# Patient Record
Sex: Female | Born: 1985 | Race: White | Hispanic: No | Marital: Married | Smoking: Former smoker
Health system: Southern US, Community
[De-identification: ages and names within clinical notes are randomized; demographics above are authoritative.]

## PROBLEM LIST (undated history)

## (undated) ENCOUNTER — Inpatient Hospital Stay (HOSPITAL_COMMUNITY): Payer: Self-pay

## (undated) DIAGNOSIS — F32A Depression, unspecified: Secondary | ICD-10-CM

## (undated) DIAGNOSIS — Z8619 Personal history of other infectious and parasitic diseases: Secondary | ICD-10-CM

## (undated) DIAGNOSIS — F1911 Other psychoactive substance abuse, in remission: Secondary | ICD-10-CM

## (undated) DIAGNOSIS — F419 Anxiety disorder, unspecified: Secondary | ICD-10-CM

## (undated) DIAGNOSIS — F329 Major depressive disorder, single episode, unspecified: Secondary | ICD-10-CM

## (undated) HISTORY — DX: Depression, unspecified: F32.A

## (undated) HISTORY — DX: Other psychoactive substance abuse, in remission: F19.11

## (undated) HISTORY — DX: Major depressive disorder, single episode, unspecified: F32.9

## (undated) HISTORY — DX: Personal history of other infectious and parasitic diseases: Z86.19

## (undated) HISTORY — DX: Anxiety disorder, unspecified: F41.9

## (undated) HISTORY — PX: NO PAST SURGERIES: SHX2092

---

## 2015-07-13 ENCOUNTER — Emergency Department (HOSPITAL_COMMUNITY)
Admission: EM | Admit: 2015-07-13 | Discharge: 2015-07-13 | Disposition: A | Discharge status: Home / Self Care | Payer: Self-pay | Attending: Emergency Medicine | Admitting: Emergency Medicine

## 2015-07-13 ENCOUNTER — Encounter (HOSPITAL_COMMUNITY): Payer: Self-pay | Admitting: Emergency Medicine

## 2015-07-13 DIAGNOSIS — H6001 Abscess of right external ear: Secondary | ICD-10-CM | POA: Insufficient documentation

## 2015-07-13 DIAGNOSIS — F17210 Nicotine dependence, cigarettes, uncomplicated: Secondary | ICD-10-CM | POA: Insufficient documentation

## 2015-07-13 MED ORDER — LIDOCAINE HCL 2 % IJ SOLN
10.0000 mL | Freq: Once | INTRAMUSCULAR | Status: AC
  Administered 2015-07-13: 200 mg via INTRADERMAL
  Filled 2015-07-13: qty 20

## 2015-07-13 NOTE — ED Notes (Signed)
I&D tray set up at bedside.

## 2015-07-13 NOTE — ED Provider Notes (Signed)
CSN: [id number]     Arrival date & time 07/13/15  0906 History  By signing my name below, I, Donna Nunez, attest that this documentation has been prepared under the direction and in the presence of non-physician practitioner, Fayrene Helper, PA-C. Electronically Signed: Freida Nunez, Scribe. 07/13/2015. 9:27 AM.      Chief Complaint  Patient presents with  . Abscess   The history is provided by the patient. No language interpreter was used.     HPI Comments:  Donna Nunez is a 30 y.o. female who presents to the Emergency Department complaining of a small painful bump behind the right ear. She first noticed the area ~ 2 weeks ago and notes it has gradually worsened since. She has been applying a warm heating pad to the area with minimal relief. Her tetanus is UTD within the last 5 years. Pt recently increased the sized of her ear gauges.   History reviewed. No pertinent past medical history. History reviewed. No pertinent past surgical history. No family history on file. Social History  Substance Use Topics  . Smoking status: Current Every Day Smoker -- 1.00 packs/day    Types: Cigarettes  . Smokeless tobacco: None  . Alcohol Use: Yes     Comment: socially   OB History    No data available     Review of Systems  Constitutional: Negative for fever.  Skin:       +Small "bump" behind right ear    Allergies  Review of patient's allergies indicates no known allergies.  Home Medications   Prior to Admission medications   Not on File   BP 126/70 mmHg  Pulse 62  Temp(Src) 97.9 F (36.6 C) (Oral)  Resp 20  Ht  (1.727 m)  Wt 185 lb (83.915 kg)  BMI 28.14 kg/m2  SpO2 100%  LMP 06/12/2015 Physical Exam  Constitutional: She is oriented to person, place, and time. She appears well-developed and well-nourished. No distress.  HENT:  Head: Normocephalic and atraumatic.  Eyes: Conjunctivae are normal.  Cardiovascular: Normal rate.   Pulmonary/Chest: Effort normal.   Abdominal: She exhibits no distension.  Neurological: She is alert and oriented to person, place, and time.  Skin: Skin is warm and dry. No erythema.  Pea sized area of induration and fluctuance noted to posterior right ear with TTP; no skin erythema    Psychiatric: She has a normal mood and affect.  Nursing note and vitals reviewed.   ED Course  Procedures   DIAGNOSTIC STUDIES:  Oxygen Saturation is 100% on RA, normal by my interpretation.    COORDINATION OF CARE:  9:30 AM Discussed treatment plan with pt at bedside and pt agreed to plan.  INCISION AND DRAINAGE PROCEDURE NOTE: Patient identification was confirmed and verbal consent was obtained. This procedure was performed by Fayrene Helper, PA-C at 9:57 AM. Site: R posterior auricular region Sterile procedures observed Needle size: 25 gauge Anesthetic used (type and amt): 1ml Blade size: 11 Drainage: mild Complexity: Simple Packing used: none Site anesthetized, incision made over site, wound drained and explored loculations, rinsed with copious amounts of normal saline, wound packed with sterile gauze, covered with dry, sterile dressing.  Pt tolerated procedure well without complications.  Instructions for care discussed verbally and pt provided with additional written instructions for homecare and f/u.   MDM   Final diagnoses:  Abscess of right external ear    BP 126/70 mmHg  Pulse 62  Temp(Src) 97.9 F (36.6 C) (Oral)  Resp 20  Ht  (1.727 m)  Wt 83.915 kg  BMI 28.14 kg/m2  SpO2 100%  LMP 06/12/2015   Patient with skin abscess. Incision and drainage performed in the ED today.  Abscess was not large enough to warrant packing or drain placement. Wound recheck in 2 days. Supportive care and return precautions discussed.  . The patient appears reasonably screened and/or stabilized for discharge and I doubt any other emergent medical condition requiring further screening, evaluation, or treatment in the ED  prior to discharge.  I personally performed the services described in this documentation, which was scribed in my presence. The recorded information has been reviewed and is accurate.      Fayrene Helper, PA-C 07/13/15 1014  Vanetta Mulders, MD 07/15/15 2235

## 2015-07-13 NOTE — ED Notes (Signed)
Patient states abscess behind R ear.  Patient states has been getting worse x 2 wks.

## 2015-07-13 NOTE — Discharge Instructions (Signed)
Continue to apply warm compress several times daily to decrease risk of recurrent abscess.  Apply neosporin and dressing to keep wound clean  Abscess An abscess is an infected area that contains a collection of pus and debris.It can occur in almost any part of the body. An abscess is also known as a furuncle or boil. CAUSES  An abscess occurs when tissue gets infected. This can occur from blockage of oil or sweat glands, infection of hair follicles, or a minor injury to the skin. As the body tries to fight the infection, pus collects in the area and creates pressure under the skin. This pressure causes pain. People with weakened immune systems have difficulty fighting infections and get certain abscesses more often.  SYMPTOMS Usually an abscess develops on the skin and becomes a painful mass that is red, warm, and tender. If the abscess forms under the skin, you may feel a moveable soft area under the skin. Some abscesses break open (rupture) on their own, but most will continue to get worse without care. The infection can spread deeper into the body and eventually into the bloodstream, causing you to feel ill.  DIAGNOSIS  Your caregiver will take your medical history and perform a physical exam. A sample of fluid may also be taken from the abscess to determine what is causing your infection. TREATMENT  Your caregiver may prescribe antibiotic medicines to fight the infection. However, taking antibiotics alone usually does not cure an abscess. Your caregiver may need to make a small cut (incision) in the abscess to drain the pus. In some cases, gauze is packed into the abscess to reduce pain and to continue draining the area. HOME CARE INSTRUCTIONS   Only take over-the-counter or prescription medicines for pain, discomfort, or fever as directed by your caregiver.  If you were prescribed antibiotics, take them as directed. Finish them even if you start to feel better.  If gauze is used, follow your  caregiver's directions for changing the gauze.  To avoid spreading the infection:  Keep your draining abscess covered with a bandage.  Wash your hands well.  Do not share personal care items, towels, or whirlpools with others.  Avoid skin contact with others.  Keep your skin and clothes clean around the abscess.  Keep all follow-up appointments as directed by your caregiver. SEEK MEDICAL CARE IF:   You have increased pain, swelling, redness, fluid drainage, or bleeding.  You have muscle aches, chills, or a general ill feeling.  You have a fever. MAKE SURE YOU:   Understand these instructions.  Will watch your condition.  Will get help right away if you are not doing well or get worse.   This information is not intended to replace advice given to you by your health care provider. Make sure you discuss any questions you have with your health care provider.   Document Released: 03/07/2005 Document Revised: 11/27/2011 Document Reviewed: 08/10/2011 Elsevier Interactive Patient Education [employer]

## 2017-06-06 ENCOUNTER — Ambulatory Visit: Payer: 59 | Admitting: Family Medicine

## 2017-06-06 ENCOUNTER — Encounter: Payer: Self-pay | Admitting: Family Medicine

## 2017-06-06 VITALS — BP 122/72 | HR 67 | Ht 68.5 in | Wt 185.8 lb

## 2017-06-06 DIAGNOSIS — Z8619 Personal history of other infectious and parasitic diseases: Secondary | ICD-10-CM

## 2017-06-06 DIAGNOSIS — Z0000 Encounter for general adult medical examination without abnormal findings: Secondary | ICD-10-CM

## 2017-06-06 DIAGNOSIS — F419 Anxiety disorder, unspecified: Secondary | ICD-10-CM

## 2017-06-06 DIAGNOSIS — F329 Major depressive disorder, single episode, unspecified: Secondary | ICD-10-CM | POA: Diagnosis not present

## 2017-06-06 DIAGNOSIS — F32A Depression, unspecified: Secondary | ICD-10-CM | POA: Insufficient documentation

## 2017-06-06 DIAGNOSIS — Z23 Encounter for immunization: Secondary | ICD-10-CM

## 2017-06-06 DIAGNOSIS — F1911 Other psychoactive substance abuse, in remission: Secondary | ICD-10-CM | POA: Insufficient documentation

## 2017-06-06 LAB — CBC WITH DIFFERENTIAL/PLATELET
BASOS ABS: 62 {cells}/uL (ref 0–200)
Basophils Relative: 0.9 %
EOS ABS: 110 {cells}/uL (ref 15–500)
Eosinophils Relative: 1.6 %
HEMATOCRIT: 41.2 % (ref 35.0–45.0)
HEMOGLOBIN: 14.1 g/dL (ref 11.7–15.5)
LYMPHS ABS: 2222 {cells}/uL (ref 850–3900)
MCH: 30.1 pg (ref 27.0–33.0)
MCHC: 34.2 g/dL (ref 32.0–36.0)
MCV: 87.8 fL (ref 80.0–100.0)
MPV: 10.7 fL (ref 7.5–12.5)
Monocytes Relative: 7.4 %
NEUTROS ABS: 3995 {cells}/uL (ref 1500–7800)
NEUTROS PCT: 57.9 %
Platelets: 291 10*3/uL (ref 140–400)
RBC: 4.69 10*6/uL (ref 3.80–5.10)
RDW: 11.6 % (ref 11.0–15.0)
Total Lymphocyte: 32.2 %
WBC: 6.9 10*3/uL (ref 3.8–10.8)
WBCMIX: 511 {cells}/uL (ref 200–950)

## 2017-06-06 LAB — POCT URINALYSIS DIP (PROADVANTAGE DEVICE)
BILIRUBIN UA: NEGATIVE
Glucose, UA: NEGATIVE mg/dL
Ketones, POC UA: NEGATIVE mg/dL
Leukocytes, UA: NEGATIVE
NITRITE UA: NEGATIVE
PH UA: 6.5 (ref 5.0–8.0)
PROTEIN UA: NEGATIVE mg/dL
RBC UA: NEGATIVE
Specific Gravity, Urine: 1.02
Urobilinogen, Ur: NEGATIVE

## 2017-06-06 LAB — COMPREHENSIVE METABOLIC PANEL
AG RATIO: 2.4 (calc) (ref 1.0–2.5)
ALKALINE PHOSPHATASE (APISO): 55 U/L (ref 33–115)
ALT: 10 U/L (ref 6–29)
AST: 17 U/L (ref 10–30)
Albumin: 4.8 g/dL (ref 3.6–5.1)
BILIRUBIN TOTAL: 0.6 mg/dL (ref 0.2–1.2)
BUN: 11 mg/dL (ref 7–25)
CALCIUM: 9.9 mg/dL (ref 8.6–10.2)
CO2: 29 mmol/L (ref 20–32)
Chloride: 103 mmol/L (ref 98–110)
Creat: 0.88 mg/dL (ref 0.50–1.10)
Globulin: 2 g/dL (calc) (ref 1.9–3.7)
Glucose, Bld: 84 mg/dL (ref 65–99)
Potassium: 4.8 mmol/L (ref 3.5–5.3)
SODIUM: 139 mmol/L (ref 135–146)
TOTAL PROTEIN: 6.8 g/dL (ref 6.1–8.1)

## 2017-06-06 MED ORDER — BUPROPION HCL ER (XL) 300 MG PO TB24: 300.0000 mg | ORAL_TABLET | Freq: Every day | ORAL | 2 refills | Status: DC

## 2017-06-06 NOTE — Patient Instructions (Signed)

## 2017-06-06 NOTE — Progress Notes (Signed)
Subjective:    Patient ID: Donna Nunez, female    DOB: 1986/03/01, 31 y.o.   MRN: [id number]  HPI Chief Complaint  Patient presents with  . New Patient (Initial Visit)    [facility] in July,    She is new to the practice and here for a complete physical exam. Previous medical care: [facility] and PCP Donna Nunez  Last CPE: years ago.   Other providers: has a therapist and goes every other week for anxiety and depression. Just had her first visit. Denies SI or HI.   Past medical history: started on Wellbutrin for depression and anxiety in October 2018. Feels more stable.  States medication worked for smoking cessation. Stopped smoking in September 2017. She was taking Wellbutrin at that time as well.  Has taken fluoxetine in past but stopped because she did not feel like it was helping.  States she was prescribed hydroxyzine for anxiety but does not like this due to sedation.   History of drug and alcohol abuse per patient. History of IV drug use.   States she was tested for all STDs in the spring and declines this today.   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   Immunizations: needs flu shot. Tdap up to date.   Health maintenance:  Mammogram: N/A Colonoscopy: N/A Last Gynecological Exam with pap smear: July 2018. States she has history HPV with high risk.  Last Menstrual cycle: 05/19/2017 Contraception: none. Ok if  Pregnancies: 1. "failed pregnancy".  Last Dental Exam: years ago Last Eye Exam: 2015   Depression screen PHQ 2/9 06/06/2017  Decreased Interest 0  Down, Depressed, Hopeless 0  PHQ - 2 Score 0    Wears seatbelt always, uses sunscreen, smoke detectors in home and functioning, does not text while driving and feels safe in home environment.   Reviewed allergies, medications, past medical, surgical, family, and social  history.    Review of Systems Review of Systems Constitutional: -fever, -chills, -sweats, -unexpected weight change,-fatigue ENT: -runny nose, -ear pain, -sore throat Cardiology:  -chest pain, -palpitations, -edema Respiratory: -cough, -shortness of breath, -wheezing Gastroenterology: -abdominal pain, -nausea, -vomiting, -diarrhea, -constipation  Hematology: -bleeding or bruising problems Musculoskeletal: -arthralgias, -myalgias, -joint swelling, -back pain Ophthalmology: -vision changes Urology: -dysuria, -difficulty urinating, -hematuria, -urinary frequency, -urgency Neurology: -headache, -weakness, -tingling, -numbness        Objective:   Physical Exam BP 122/72 (BP Location: Right Arm, Patient Position: Sitting)   Pulse 67   Ht 5' 8.5" (1.74 m)   Wt 185 lb 12.8 oz (84.3 kg)   LMP 05/19/2017 (Approximate)   SpO2 98%   BMI 27.84 kg/m   General Appearance:    Alert, cooperative, no distress, appears stated age  Head:    Normocephalic, without obvious abnormality, atraumatic  Eyes:    PERRL, conjunctiva/corneas clear, EOM's intact, fundi    benign  Ears:    Normal TM's and external ear canals  Nose:   Nares normal, mucosa normal, no drainage or sinus   tenderness  Throat:   Lips, mucosa, and tongue normal; teeth and gums normal  Neck:   Supple, no lymphadenopathy;  thyroid:  no   enlargement/tenderness/nodules; no carotid   bruit or JVD  Back:    Spine nontender, no curvature, ROM normal, no CVA     tenderness  Lungs:  Clear to auscultation bilaterally without wheezes, rales or     ronchi; respirations unlabored  Chest Wall:    No tenderness or deformity   Heart:    Regular rate and rhythm, S1 and S2 normal, no murmur, rub   or gallop  [facility]  Abdomen:     Soft, non-tender, nondistended, normoactive bowel sounds,    no masses, no hepatosplenomegaly  Genitalia:    [facility].   Rectal:     Not performed due to age<40 and no related complaints  Extremities:   No clubbing, cyanosis or edema  Pulses:   2+ and symmetric all extremities  Skin:   Skin color, texture, turgor normal, no rashes or lesions. Several tattoos  Lymph nodes:   Cervical, supraclavicular, and axillary nodes normal  Neurologic:   CNII-XII intact, normal strength, sensation and gait; reflexes 2+ and symmetric throughout          Psych:   Normal mood, affect, hygiene and grooming.     Urinalysis dipstick: negative       Assessment & Plan:  Routine general medical examination at a health care facility - Plan: CBC with Differential/Platelet, Comprehensive metabolic panel, POCT Urinalysis DIP (Proadvantage Device)  Anxiety and depression - Plan: buPROPion (WELLBUTRIN XL) 300 MG 24 hr tablet  History of HPV infection  Needs flu shot - Plan: Flu Vaccine QUAD 36+ mos IM  Apparently she has had issues with depression and anxiety that has been worsening over the past 4 months but states her mood seems to be improving over the past month since being back on Wellbutrin. Discussed the option of switching her from hydroxyzine to a SSRI. She did not like fluoxetine in the past. She is attempting pregnancy. We will hold off on any new medications for now.  Mood appears to be stable. Continue seeing a therapist. Refilled Wellbutrin. She will let me know if she would like to consider additional medication.  She appears to be doing well otherwise.  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 We discussed recommendations of co-testing annually for this.  Declines STD testing. She had this in July 2018 per patient.  Flu shot given.  Follow up pending labs.

## 2017-06-07 ENCOUNTER — Telehealth: Payer: Self-pay

## 2017-06-07 NOTE — Telephone Encounter (Signed)
LVM, gave lab results. Gave call back number if any questions or concerns.  

## 2017-06-07 NOTE — Telephone Encounter (Signed)
-----   Message from Avanell ShackletonVickie L Henson, NP-C sent at 06/06/2017 10:25 PM EST ----- Her blood work is normal.

## 2017-07-10 ENCOUNTER — Telehealth: Payer: Self-pay

## 2017-07-10 NOTE — Telephone Encounter (Signed)
Pt was called to reschedule her appt with Vickie . No answer left a voice mail . Thanks [employer]

## 2017-07-11 ENCOUNTER — Ambulatory Visit: Payer: Self-pay | Admitting: Family Medicine

## 2017-07-11 ENCOUNTER — Ambulatory Visit: Payer: 59 | Admitting: Family Medicine

## 2017-07-11 ENCOUNTER — Institutional Professional Consult (permissible substitution): Payer: 59 | Admitting: Family Medicine

## 2017-07-11 ENCOUNTER — Encounter: Payer: Self-pay | Admitting: Family Medicine

## 2017-07-11 VITALS — BP 110/70 | HR 58 | Wt 185.6 lb

## 2017-07-11 DIAGNOSIS — F32A Depression, unspecified: Secondary | ICD-10-CM

## 2017-07-11 DIAGNOSIS — F329 Major depressive disorder, single episode, unspecified: Secondary | ICD-10-CM

## 2017-07-11 DIAGNOSIS — F419 Anxiety disorder, unspecified: Secondary | ICD-10-CM

## 2017-07-11 MED ORDER — CITALOPRAM HYDROBROMIDE 20 MG PO TABS: 20.0000 mg | ORAL_TABLET | Freq: Every day | ORAL | 3 refills | Status: DC

## 2017-07-11 MED ORDER — ALPRAZOLAM 0.25 MG PO TABS: 0.2500 mg | ORAL_TABLET | Freq: Two times a day (BID) | ORAL | 0 refills | Status: DC | PRN

## 2017-07-11 NOTE — Progress Notes (Signed)
   Subjective:    Patient ID: Donna Nunez, female    DOB: May 15, 1986, 32 y.o.   MRN: [id number]  HPI She is here for a consult concerning her underlying anxiety and depression.  The Wellbutrin has helped but she is still having difficulty with anxiety and states that the Atarax is making her too sleepy.  She would like to start on something different.  She continues in counseling.     Review of Systems     Objective:   Physical Exam Alert and in no distress otherwise not examined       Assessment & Plan:  Anxiety and depression - Plan: citalopram (CELEXA) 20 MG tablet, ALPRAZolam (XANAX) 0.25 MG tablet I will add Celexa to her regimen.  We will also give some Xanax to help if she needs coverage for anxiety.  Discussed the use of Xanax in terms of only using it for anxiety not the anticipation of anxiety.  She will follow-up here in 1 month with Chip BoerVicki

## 2017-07-23 ENCOUNTER — Telehealth: Payer: Self-pay | Admitting: Family Medicine

## 2017-07-23 DIAGNOSIS — F329 Major depressive disorder, single episode, unspecified: Secondary | ICD-10-CM

## 2017-07-23 DIAGNOSIS — F419 Anxiety disorder, unspecified: Principal | ICD-10-CM

## 2017-07-23 DIAGNOSIS — F32A Depression, unspecified: Secondary | ICD-10-CM

## 2017-07-23 MED ORDER — BUPROPION HCL ER (XL) 300 MG PO TB24: 300.0000 mg | ORAL_TABLET | Freq: Every day | ORAL | 2 refills | Status: DC

## 2017-07-23 NOTE — Telephone Encounter (Signed)
Requesting refill on Bupropion 300 mg [facility] [address]

## 2017-07-23 NOTE — Telephone Encounter (Signed)
done

## 2017-07-23 NOTE — Telephone Encounter (Signed)
Ok

## 2017-08-15 ENCOUNTER — Ambulatory Visit: Payer: 59 | Admitting: Family Medicine

## 2017-11-08 ENCOUNTER — Encounter: Payer: Self-pay | Admitting: Family Medicine

## 2017-11-08 ENCOUNTER — Ambulatory Visit: Payer: 59 | Admitting: Family Medicine

## 2017-11-08 VITALS — BP 120/70 | HR 63 | Temp 98.2°F | Resp 16 | Wt 193.6 lb

## 2017-11-08 DIAGNOSIS — R058 Other specified cough: Secondary | ICD-10-CM

## 2017-11-08 DIAGNOSIS — F419 Anxiety disorder, unspecified: Secondary | ICD-10-CM

## 2017-11-08 DIAGNOSIS — F329 Major depressive disorder, single episode, unspecified: Secondary | ICD-10-CM | POA: Diagnosis not present

## 2017-11-08 DIAGNOSIS — J069 Acute upper respiratory infection, unspecified: Secondary | ICD-10-CM | POA: Diagnosis not present

## 2017-11-08 DIAGNOSIS — R05 Cough: Secondary | ICD-10-CM

## 2017-11-08 DIAGNOSIS — F32A Depression, unspecified: Secondary | ICD-10-CM

## 2017-11-08 MED ORDER — CITALOPRAM HYDROBROMIDE 20 MG PO TABS: 20.0000 mg | ORAL_TABLET | Freq: Every day | ORAL | 3 refills | Status: DC

## 2017-11-08 MED ORDER — AZITHROMYCIN 250 MG PO TABS: ORAL_TABLET | ORAL | 0 refills | Status: DC

## 2017-11-08 NOTE — Patient Instructions (Signed)
Continue treating your symptoms and if you think you are getting worse or if you are not getting better in the next day or 2 then you can start the antibiotic.

## 2017-11-08 NOTE — Progress Notes (Signed)
Chief Complaint  Patient presents with  . chest cold    started feeling bad saturday, been out all week due to illness. started feeling better last night. but feeling tired. ear pressure. cough,     Subjective:  Donna Nunez is a 32 y.o. female who presents for a 7 day history of fatigue, nasal congestion, ear pain, sore throat, cough and congestion. States she feels like she is getting worse. States she missed work some days this week.   Denies fever, chills, headache, chest pain, shortness of breath, wheezing, abdominal pain, N/V/D, LE edema.   Treatment to date: Dayquil, Nyquil, tea, cough drops.  Denies sick contacts.  No other aggravating or relieving factors. ROS as in subjective.   Started on Celexa in January for anxiety and depression. Reports doing much better on this and no side effects. Requests a refill. She is also meditating and seeing a Veterinary surgeoncounselor.    Objective: Vitals:   11/08/17 1510  BP: 120/70  Pulse: 63  Resp: 16  Temp: 98.2 F (36.8 C)  SpO2: 98%    General appearance: Alert, WD/WN, no distress, mildly ill appearing                             Skin: warm, no rash                           Head: no sinus tenderness                            Eyes: conjunctiva normal, corneas clear, PERRLA                            Ears: pearly TMs, external ear canals normal                          Nose: septum midline, turbinates swollen, with erythema and clear discharge             Mouth/throat: MMM, tongue normal, mild pharyngeal erythema                           Neck: supple, no adenopathy, no thyromegaly, nontender                          Heart: RRR, normal S1, S2, no murmurs                         Lungs: CTA bilaterally, no wheezes, rales, or rhonchi      Assessment: Acute URI  Anxiety and depression - Plan: citalopram (CELEXA) 20 MG tablet  Productive cough - Plan: azithromycin (ZITHROMAX Z-PAK) 250 MG tablet    Plan: Discussed diagnosis and  treatment of URI and productive cough.  She will continue with symptomatic treatment and if she is not improving in the next day or 2 or if she gets worse she will start the Z-Pak as prescribed.  Suggested symptomatic OTC remedies.Nasal saline spray for congestion.  Tylenol or Ibuprofen OTC for fever and malaise.  Call/return if not back to baseline after completing the antibiotic if she does indeed take this.     She appears to be doing well on the Celexa without  any side effects.  I will refill this and she will continue seeing her counselor. Follow-up in 6 months.

## 2017-11-18 ENCOUNTER — Other Ambulatory Visit: Payer: Self-pay | Admitting: Family Medicine

## 2017-11-18 DIAGNOSIS — F329 Major depressive disorder, single episode, unspecified: Secondary | ICD-10-CM

## 2017-11-18 DIAGNOSIS — F419 Anxiety disorder, unspecified: Principal | ICD-10-CM

## 2017-11-18 DIAGNOSIS — F32A Depression, unspecified: Secondary | ICD-10-CM

## 2017-11-19 NOTE — Telephone Encounter (Signed)
[facility] is requesting to fill pt xanax. Please advise KH 

## 2017-12-19 ENCOUNTER — Ambulatory Visit: Payer: 59 | Admitting: Family Medicine

## 2017-12-20 ENCOUNTER — Ambulatory Visit: Payer: 59 | Admitting: Family Medicine

## 2017-12-20 ENCOUNTER — Encounter: Payer: Self-pay | Admitting: Family Medicine

## 2017-12-20 ENCOUNTER — Ambulatory Visit: Payer: BLUE CROSS/BLUE SHIELD | Admitting: Family Medicine

## 2017-12-20 VITALS — BP 110/70 | HR 67 | Temp 98.7°F | Wt 192.4 lb

## 2017-12-20 DIAGNOSIS — M546 Pain in thoracic spine: Secondary | ICD-10-CM

## 2017-12-20 DIAGNOSIS — R239 Unspecified skin changes: Secondary | ICD-10-CM

## 2017-12-20 DIAGNOSIS — G8929 Other chronic pain: Secondary | ICD-10-CM | POA: Diagnosis not present

## 2017-12-20 DIAGNOSIS — R51 Headache: Secondary | ICD-10-CM

## 2017-12-20 DIAGNOSIS — R519 Headache, unspecified: Secondary | ICD-10-CM

## 2017-12-20 NOTE — Patient Instructions (Signed)
Try using heat or ice on your back. Take an anti-inflammatory such as Aleve or ibuprofen with food and try a topical analgesic such as Arnicare gel, Biofreeze or others.   Take a picture of the area on your back and let's monitor this for now.   Also, keep a headache journal and look for patterns or triggers. Try to get regular sleep, avoid skipping meals or fluctuations in caffeine intake. Drink plenty of water.   Follow up in 2 -3 weeks for these issues.

## 2017-12-20 NOTE — Progress Notes (Signed)
   Subjective:    Patient ID: Donna Nunez, female    DOB: 1986-03-21, 32 y.o.   MRN: [id number]  HPI Chief Complaint  Patient presents with  . marking    marking down back but doesn't hurt. noticied it a couple days ago  . refill on meds    meds doing well for her. pq2 is normal and would like refill for 90 days   She is here with complaints of a reddish mark down her back since Monday. No known injury. Denies tenderness, pruritis. Denies history of the same. States she asked her sister who is a NP and her sister's boss who is a Midwifeneurosurgeon what they thought the mark was related to and they did not know.  She does also report intermittent midline back pain that is present with movement and relieved by rest. Pain is non radiating. Denies numbness, tingling or abnormal sensations. No weakness.  Denies fever, chills, chest pain, palpitations, abdominal pain, N/V/D.   One week history of headaches that have felt like her usual headaches.  History of migraine headaches and has had 3 this week. Started having these last year. Went away mostly after getting new eye glasses.  Pain was located behind her left eye. Denies headache today.   States she has not been sleeping well this past week.   Reviewed allergies, medications, past medical, surgical, family, and social history.   Review of Systems Pertinent positives and negatives in the history of present illness.     Objective:   Physical Exam  Constitutional: She is oriented to person, place, and time. She appears well-developed and well-nourished. No distress.  Neck: Normal range of motion. Neck supple.  Musculoskeletal:       Cervical back: Normal.       Thoracic back: Normal.       Lumbar back: Normal.  Tightness to lumbar paraspinal muscles, no bony tenderness, normal curvature and movement  Neurological: She is alert and oriented to person, place, and time. She has normal strength. No cranial nerve deficit or sensory deficit.  Gait normal.  Skin:     Hyperpigmented skin to mid back along her spine, blanches, no sign of infection.  No other rash. No pallor.    BP 110/70   Pulse 67   Temp 98.7 F (37.1 C) (Oral)   Wt 192 lb 6.4 oz (87.3 kg)   SpO2 98%   BMI 28.83 kg/m         Assessment & Plan:  Abnormal appearance of skin  Chronic midline thoracic back pain  Nonintractable headache, unspecified chronicity pattern, unspecified headache type  Discussed that her symptoms do not appear to be related. The abnormal skin discoloration does not appear to be an infection, unknown etiology. We will do watchful waiting. Consider a referral to dermatology if this is not back to baseline or if it worsens.  Midline back pain is intermittent and seems to be MSK related. Try conservative treatment for now with NSAIDs, ice or heat, topical analgesic. Consider XR and PT if not improving.  Headaches are not new. Asymptomatic today. She will keep a journal and return in 2 weeks or sooner if symptoms worsen.

## 2018-01-03 DIAGNOSIS — T63461A Toxic effect of venom of wasps, accidental (unintentional), initial encounter: Secondary | ICD-10-CM | POA: Diagnosis not present

## 2018-02-22 ENCOUNTER — Other Ambulatory Visit: Payer: Self-pay | Admitting: Family Medicine

## 2018-02-22 DIAGNOSIS — F419 Anxiety disorder, unspecified: Principal | ICD-10-CM

## 2018-02-22 DIAGNOSIS — F329 Major depressive disorder, single episode, unspecified: Secondary | ICD-10-CM

## 2018-02-22 DIAGNOSIS — F32A Depression, unspecified: Secondary | ICD-10-CM

## 2018-02-24 NOTE — Telephone Encounter (Signed)
Is this okay to refill? 

## 2018-02-24 NOTE — Telephone Encounter (Signed)
She needs a follow-up appointment with Chip BoerVicki make sure she has enough meds to cover her until then

## 2018-02-25 ENCOUNTER — Telehealth: Payer: Self-pay

## 2018-02-25 NOTE — Telephone Encounter (Signed)
Pt was called and she will call back later to make appt. KH

## 2018-02-25 NOTE — Telephone Encounter (Signed)
Called pt to advise he that a med check apt is needed Per JCL. Pt says she will call back later today to make the appt. KH

## 2018-03-21 ENCOUNTER — Telehealth: Payer: Self-pay | Admitting: Family Medicine

## 2018-03-21 DIAGNOSIS — F419 Anxiety disorder, unspecified: Principal | ICD-10-CM

## 2018-03-21 DIAGNOSIS — F32A Depression, unspecified: Secondary | ICD-10-CM

## 2018-03-21 DIAGNOSIS — F329 Major depressive disorder, single episode, unspecified: Secondary | ICD-10-CM

## 2018-03-21 MED ORDER — CITALOPRAM HYDROBROMIDE 20 MG PO TABS: 20.0000 mg | ORAL_TABLET | Freq: Every day | ORAL | 0 refills | Status: AC

## 2018-03-21 MED ORDER — BUPROPION HCL ER (XL) 300 MG PO TB24: 300.0000 mg | ORAL_TABLET | Freq: Every day | ORAL | 0 refills | Status: AC

## 2018-03-21 NOTE — Telephone Encounter (Signed)
Ok to give her medication until she comes in for follow up.

## 2018-03-21 NOTE — Telephone Encounter (Signed)
Pt called and states that she needs a refill on her Celexa and her Wellbutrin pt made a medcheck appt for 10-24/2019, pt would like it sent to the [facility] [address] [place] - [address] [place] pt would like a call back that this was sent it at [phone]

## 2018-03-21 NOTE — Telephone Encounter (Signed)
refilled 

## 2018-04-03 ENCOUNTER — Ambulatory Visit: Payer: BLUE CROSS/BLUE SHIELD | Admitting: Family Medicine

## 2018-04-03 ENCOUNTER — Encounter: Payer: Self-pay | Admitting: Family Medicine

## 2018-04-03 VITALS — BP 122/80 | HR 73 | Temp 98.8°F | Wt 202.8 lb

## 2018-04-03 DIAGNOSIS — F32A Depression, unspecified: Secondary | ICD-10-CM

## 2018-04-03 DIAGNOSIS — F329 Major depressive disorder, single episode, unspecified: Secondary | ICD-10-CM

## 2018-04-03 DIAGNOSIS — F419 Anxiety disorder, unspecified: Secondary | ICD-10-CM

## 2018-04-03 DIAGNOSIS — Z3201 Encounter for pregnancy test, result positive: Secondary | ICD-10-CM

## 2018-04-03 LAB — POCT URINE PREGNANCY: Preg Test, Ur: POSITIVE — AB

## 2018-04-03 NOTE — Patient Instructions (Signed)
Obgyn Offices:   [facility] [address] [place], [place] [address] Phone: [phone]  [facility] [address] [place], [place] [address] [phone]  [facility] Address: [address]                   [place], [place] [address] Phone: [phone]  [facility] [address] [place], [place] [address] Phone: [phone]      Commonly Asked Questions During Pregnancy  Cats: A parasite can be excreted in cat feces.  To avoid exposure you need to have another person empty the little box.  If you must empty the litter box you will need to wear gloves.  Wash your hands after handling your cat.  This parasite can also be found in raw or undercooked meat so this should also be avoided.  Colds, Sore Throats, Flu: Please check your medication sheet to see what you can take for symptoms.  If your symptoms are unrelieved by these medications please call the office.  Dental Work: Most any dental work Agricultural consultant recommends is permitted.  X-rays should only be taken during the first trimester if absolutely necessary.  Your abdomen should be shielded with a lead apron during all x-rays.  Please notify your provider prior to receiving any x-rays.  Novocaine is fine; gas is not recommended.  If your dentist requires a note from [place] prior to dental work please call the office and we will provide one for you.  Exercise: Exercise is an important part of staying healthy during your pregnancy.  You may continue most exercises you were accustomed to prior to pregnancy.  Later in your pregnancy you will most likely notice you have difficulty with activities requiring balance like riding a bicycle.  It is important that you listen to your body and avoid activities that put you at a higher risk of falling.  Adequate rest and staying well hydrated are a must!  If you have questions about the safety of  specific activities ask your provider.    Exposure to Children with illness: Try to avoid obvious exposure; report any symptoms to [place] when noted,  If you have chicken pos, red measles or mumps, you should be immune to these diseases.   Please do not take any vaccines while pregnant unless you have checked with your OB provider.  Fetal Movement: After 28 weeks we recommend you do "kick counts" twice daily.  Lie or sit down in a calm quiet environment and count your baby movements "kicks".  You should feel your baby at least 10 times per hour.  If you have not felt 10 kicks within the first hour get up, walk around and have something sweet to eat or drink then repeat for an additional hour.  If count remains less than 10 per hour notify your provider.  Fumigating: Follow your pest control agent's advice as to how long to stay out of your home.  Ventilate the area well before re-entering.  Hemorrhoids:   Most over-the-counter preparations can be used during pregnancy.  Check your medication to see what is safe to use.  It is important to use a stool softener or fiber in your diet and to drink lots of liquids.  If hemorrhoids seem to be getting worse please call the office.   Hot Tubs:  Hot tubs Jacuzzis and saunas are not recommended while pregnant.  These increase your internal body temperature  and should be avoided.  Intercourse:  Sexual intercourse is safe during pregnancy as long as you are comfortable, unless otherwise advised by your provider.  Spotting may occur after intercourse; report any bright red bleeding that is heavier than spotting.  Labor:  If you know that you are in labor, please go to the hospital.  If you are unsure, please call the office and let us help you decide what to do.  Lifting, straining, etc:  If your job requires heavy lifting or straining please check with your provider for any limitations.  Generally, you should not lift items heavier than that you can lift simply with  your hands and arms (no back muscles)  Painting:  Paint fumes do not harm your pregnancy, but may make you ill and should be avoided if possible.  Latex or water based paints have less odor than oils.  Use adequate ventilation while painting.  Permanents & Hair Color:  Chemicals in hair dyes are not recommended as they cause increase hair dryness which can increase hair loss during pregnancy.  " Highlighting" and permanents are allowed.  Dye may be absorbed differently and permanents may not hold as well during pregnancy.  Sunbathing:  Use a sunscreen, as skin burns easily during pregnancy.  Drink plenty of fluids; avoid over heating.  Tanning Beds:  Because their possible side effects are still unknown, tanning beds are not recommended.  Ultrasound Scans:  Routine ultrasounds are performed at approximately 20 weeks.  You will be able to see your baby's general anatomy an if you would like to know the gender this can usually be determined as well.  If it is questionable when you conceived you may also receive an ultrasound early in your pregnancy for dating purposes.  Otherwise ultrasound exams are not routinely performed unless there is a medical necessity.  Although you can request a scan we ask that you pay for it when conducted because insurance does not cover " patient request" scans.  Work: If your pregnancy proceeds without complications you may work until your due date, unless your physician or employer advises otherwise.  Round Ligament Pain/Pelvic Discomfort:  Sharp, shooting pains not associated with bleeding are fairly common, usually occurring in the second trimester of pregnancy.  They tend to be worse when standing up or when you remain standing for long periods of time.  These are the result of pressure of certain pelvic ligaments called "round ligaments".  Rest, Tylenol and heat seem to be the most effective relief.  As the womb and fetus grow, they rise out of the pelvis and the  discomfort improves.  Please notify the office if your pain seems different than that described.  It may represent a more serious condition.

## 2018-04-03 NOTE — Progress Notes (Signed)
   Subjective:    Patient ID: Donna Nunez, female    DOB: August 16, 1985, 32 y.o.   MRN: [id number]  HPI Chief Complaint  Patient presents with  . med check    med check. possiblity of pregnant. + pregnancy test and wants confirmation. pinched nerve- shoulder   She is here requesting pregnancy test.  Positive UPT at home. She and her husband have been attempting pregnancy and she is pleased.  This is her first pregnancy. She is taking a prenatal vitamin.   Does not smoke, use drugs or drink alcohol. Stopped all medications one week ago. States she is doing fine.   States she has a Veterinary surgeon if needed.   Denies fever, chills, abdominal pain, N/V/D or vaginal discharge.    Review of Systems Pertinent positives and negatives in the history of present illness.     Objective:   Physical Exam BP 122/80   Pulse 73   Temp 98.8 F (37.1 C) (Oral)   Wt 202 lb 12.8 oz (92 kg)   LMP 03/02/2018   BMI 30.39 kg/m   Alert and oriented and in no acute distress.      Assessment & Plan:  Positive pregnancy test - Plan: POCT urine pregnancy, Beta hCG quant (ref lab)  Anxiety and depression  This is her first pregnancy and she is very excited.  Estimated conception date 4 to 5 weeks ago.  She stopped all of her medications except prenatal vitamin 1 week ago.  Reports her mood is good and is not having any anxiety or feeling depressed. She does have a Veterinary surgeon and will see them as needed. Counseling done on taking good care of her health.  She does not smoke or drink alcohol.  Encouraged her to eat a healthy diet and stay well-hydrated. A handout was given on commonly asked questions in pregnancy. Advised that if she develops severe unilateral pelvic or abdominal pain that she should She does have a gynecologist and I will also give her a list of OB/GYN's to establish with. hCG quant ordered and will follow-up pending results.  Wished her and her husband the best of luck

## 2018-04-04 LAB — BETA HCG QUANT (REF LAB): hCG Quant: 2064 m[IU]/mL

## 2018-04-29 DIAGNOSIS — O26849 Uterine size-date discrepancy, unspecified trimester: Secondary | ICD-10-CM | POA: Diagnosis not present

## 2018-04-29 DIAGNOSIS — Z6831 Body mass index (BMI) 31.0-31.9, adult: Secondary | ICD-10-CM | POA: Diagnosis not present

## 2018-04-29 DIAGNOSIS — Z124 Encounter for screening for malignant neoplasm of cervix: Secondary | ICD-10-CM | POA: Diagnosis not present

## 2018-04-29 DIAGNOSIS — Z01419 Encounter for gynecological examination (general) (routine) without abnormal findings: Secondary | ICD-10-CM | POA: Diagnosis not present

## 2018-04-29 DIAGNOSIS — Z3480 Encounter for supervision of other normal pregnancy, unspecified trimester: Secondary | ICD-10-CM | POA: Diagnosis not present

## 2018-04-29 DIAGNOSIS — Z113 Encounter for screening for infections with a predominantly sexual mode of transmission: Secondary | ICD-10-CM | POA: Diagnosis not present

## 2018-04-29 DIAGNOSIS — N925 Other specified irregular menstruation: Secondary | ICD-10-CM | POA: Diagnosis not present

## 2018-05-19 ENCOUNTER — Other Ambulatory Visit: Payer: Self-pay

## 2018-05-19 ENCOUNTER — Encounter (HOSPITAL_COMMUNITY): Payer: Self-pay | Admitting: *Deleted

## 2018-05-19 ENCOUNTER — Inpatient Hospital Stay (HOSPITAL_COMMUNITY)
Admission: AD | Admit: 2018-05-19 | Discharge: 2018-05-19 | Disposition: A | Discharge status: Home / Self Care | Payer: BLUE CROSS/BLUE SHIELD | Attending: Obstetrics and Gynecology | Admitting: Obstetrics and Gynecology

## 2018-05-19 ENCOUNTER — Inpatient Hospital Stay (HOSPITAL_COMMUNITY): Payer: BLUE CROSS/BLUE SHIELD

## 2018-05-19 DIAGNOSIS — O039 Complete or unspecified spontaneous abortion without complication: Secondary | ICD-10-CM | POA: Diagnosis not present

## 2018-05-19 DIAGNOSIS — Z3A11 11 weeks gestation of pregnancy: Secondary | ICD-10-CM

## 2018-05-19 DIAGNOSIS — Z87891 Personal history of nicotine dependence: Secondary | ICD-10-CM | POA: Insufficient documentation

## 2018-05-19 DIAGNOSIS — O209 Hemorrhage in early pregnancy, unspecified: Secondary | ICD-10-CM | POA: Diagnosis not present

## 2018-05-19 DIAGNOSIS — Z3A08 8 weeks gestation of pregnancy: Secondary | ICD-10-CM | POA: Diagnosis not present

## 2018-05-19 DIAGNOSIS — O26851 Spotting complicating pregnancy, first trimester: Secondary | ICD-10-CM | POA: Diagnosis not present

## 2018-05-19 LAB — URINALYSIS, ROUTINE W REFLEX MICROSCOPIC
BACTERIA UA: NONE SEEN
Bilirubin Urine: NEGATIVE
Glucose, UA: NEGATIVE mg/dL
Ketones, ur: NEGATIVE mg/dL
Leukocytes, UA: NEGATIVE
Nitrite: NEGATIVE
Protein, ur: NEGATIVE mg/dL
SPECIFIC GRAVITY, URINE: 1.02 (ref 1.005–1.030)
pH: 5 (ref 5.0–8.0)

## 2018-05-19 LAB — POCT PREGNANCY, URINE: Preg Test, Ur: POSITIVE — AB

## 2018-05-19 MED ORDER — KETOROLAC TROMETHAMINE 10 MG PO TABS: 10.0000 mg | ORAL_TABLET | Freq: Four times a day (QID) | ORAL | 0 refills | Status: AC | PRN

## 2018-05-19 NOTE — MAU Provider Note (Addendum)
History     CSN: [id number]  Arrival date and time: 05/19/18 7829   First Provider Initiated Contact with Patient 05/19/18 2035      No chief complaint on file.  HPI  Ms.Donna Nunez is a 32 y.o. female G3P0010 @ [redacted]w[redacted]d by certain LMP here with spotting that started today around 1400. The spotting is very light. She notices it only when she wipes. The spotting is pink in color. She denies pain. She had intercourse last night and is concerned that is the reason she is spotting. She does have a history of 1 previous SAB. She had an [place] in the office around 8 weeks and baby had a heartbeat.   OB History    Gravida  3   Para      Term      Preterm      AB  1   Living        SAB  1   TAB      Ectopic      Multiple      Live Births              Past Medical History:  Diagnosis Date  . Anxiety and depression   . History of HPV infection   . History of substance abuse [facility])     Past Surgical History:  Procedure Laterality Date  . NO PAST SURGERIES      Family History  Problem Relation Age of Onset  . Diabetes Mother   . Hypertension Mother   . Hepatitis C Father   . Liver disease Father   . Heart attack Paternal Grandfather 66  . Cervical cancer Maternal Aunt 66    Social History   Tobacco Use  . Smoking status: Former Smoker    Packs/day: 1.00    Years: 15.00    Pack years: 15.00    Types: Cigarettes    Last attempt to quit: 02/10/2016    Years since quitting: 2.2  . Smokeless tobacco: Never Used  Substance Use Topics  . Alcohol use: Not Currently    Comment: socially  . Drug use: No    Comment: history of IV drug use     Allergies: No Known Allergies  Medications Prior to Admission  Medication Sig Dispense Refill Last Dose  . Prenatal Vit-Fe Fumarate-FA (PRENATAL MULTIVITAMIN) TABS tablet Take 1 tablet by mouth daily at 12 noon.   05/18/2018 at Unknown time  . ALPRAZolam (XANAX) 0.25 MG tablet TAKE ONE TABLET BY MOUTH TWICE A DAY AS  NEEDED FOR ANXIETY (Patient not taking: Reported on 04/03/2018) 20 tablet 0 Not Taking  . buPROPion (WELLBUTRIN XL) 300 MG 24 hr tablet Take 1 tablet (300 mg total) by mouth daily. (Patient not taking: Reported on 04/03/2018) 30 tablet 0 Not Taking  . citalopram (CELEXA) 20 MG tablet Take 1 tablet (20 mg total) by mouth daily. (Patient not taking: Reported on 04/03/2018) 30 tablet 0 Not Taking   Results for orders placed or performed during the hospital encounter of 05/19/18 (from the past 48 hour(s))  Urinalysis, Routine w reflex microscopic     Status: Abnormal   Collection Time: 05/19/18  7:37 PM  Result Value Ref Range   Color, Urine YELLOW YELLOW   APPearance CLEAR CLEAR   Specific Gravity, Urine 1.020 1.005 - 1.030   pH 5.0 5.0 - 8.0   Glucose, UA NEGATIVE NEGATIVE mg/dL   Hgb urine dipstick MODERATE (A) NEGATIVE   Bilirubin Urine NEGATIVE  NEGATIVE   Ketones, ur NEGATIVE NEGATIVE mg/dL   Protein, ur NEGATIVE NEGATIVE mg/dL   Nitrite NEGATIVE NEGATIVE   Leukocytes, UA NEGATIVE NEGATIVE   RBC / HPF 0-5 0 - 5 RBC/hpf   WBC, UA 0-5 0 - 5 WBC/hpf   Bacteria, UA NONE SEEN NONE SEEN   Squamous Epithelial / LPF 0-5 0 - 5   Mucus PRESENT     Comment: Performed at [facility], [address] [place], [place] [address]  Pregnancy, urine POC     Status: Abnormal   Collection Time: 05/19/18  7:43 PM  Result Value Ref Range   Preg Test, Ur POSITIVE (A) NEGATIVE    Comment:        THE SENSITIVITY OF THIS METHODOLOGY IS >24 mIU/mL   ABO/Rh     Status: None (Preliminary result)   Collection Time: 05/19/18  9:47 PM  Result Value Ref Range   ABO/RH(D)      O POS Performed at [facility], [address] [place], [place] [address]    [place] Ob Transvaginal  Result Date: 05/19/2018 CLINICAL DATA:  Vaginal bleeding, gestational age by last menstrual period 11 weeks and 0 days. EXAM: TRANSVAGINAL OB ULTRASOUND TECHNIQUE: Transvaginal ultrasound was performed for complete evaluation  of the gestation as well as the maternal uterus, adnexal regions, and pelvic cul-de-sac. COMPARISON:  None. FINDINGS: Intrauterine gestational sac: Present. Yolk sac:  Not present. Embryo:  Present. Cardiac Activity: Not present. CRL: 22.6 mm   8 w 6 d Subchorionic hemorrhage:  None visualized. Maternal uterus/adnexae: LEFT ovary not sonographically identified. Normal appearance of the RIGHT adnexa. No free fluid. IMPRESSION: Findings meet definitive criteria for nonviable pregnancy. This follows SRU consensus guidelines: Diagnostic Criteria for Nonviable Pregnancy Early in the First Trimester. Macy Mis Engl J Med [phone];369:1443-51. Acute findings discussed with and reconfirmed by [facility] Mystery Schrupp on 05/19/2018 at 9:26 pm. Electronically Signed   By: Awilda Metroourtnay  Bloomer M.D.   On: 05/19/2018 21:27   Review of Systems  Gastrointestinal: Negative for abdominal pain.  Genitourinary: Positive for vaginal bleeding.   Physical Exam   Blood pressure 112/65, pulse 78, temperature 98.3 F (36.8 C), temperature source Oral, resp. rate 18, height 5\' 9"  (1.753 m), weight 94.8 kg, last menstrual period 03/02/2018.  Physical Exam  Constitutional: She is oriented to person, place, and time. She appears well-developed and well-nourished. No distress.  Respiratory: Effort normal.  GI: Soft. She exhibits no distension. There is no tenderness. There is no rebound.  Genitourinary:  Genitourinary Comments: No blood noted on pad on bed   Musculoskeletal: Normal range of motion.  Neurological: She is alert and oriented to person, place, and time.  Skin: She is not diaphoretic.  Psychiatric: Her behavior is normal.   MAU Course  Procedures  None  MDM  Unable to doppler fetal heart tones Bedside US done; difficult to perform US per radiology shows fetal demise @ 8 weeks O positive blood type  Patient and significant other very upset about loss. Discussed briefly expectant management, cytotec, D&E. She will see OB office  to discuss this further and make a plan of care.   Assessment and Plan   A:  1. SAB (spontaneous abortion)   2. [redacted] weeks gestation of pregnancy   3. Vaginal bleeding in pregnancy, first trimester     P:  Discharge home in stable condition Bleeding precautions F/u with OB office this week Attempted to leave message with the office for f/u, VM not working. I will  call in the AM to arrange f/u for her.  Return to MAU if symptoms worsen Rx: Toradol  Venia Carbon I, NP 05/19/2018 10:23 PM

## 2018-05-19 NOTE — Discharge Instructions (Signed)

## 2018-05-19 NOTE — MAU Note (Signed)
Pt reports to MAU stating she is 2253w5d and experienced some vaginal spotting today pt states it was pink in color and she did not have to wear a pad pt thinks it must of occurred between 1400-1800. Pt reports she had sex for the first time in a while last night and is unsure if this is the cause. Pt denies abdominal pain.  LMP September 22nd 2019

## 2018-05-20 ENCOUNTER — Other Ambulatory Visit: Payer: Self-pay | Admitting: Obstetrics and Gynecology

## 2018-05-20 LAB — ABO/RH: ABO/RH(D): O POS

## 2018-05-21 ENCOUNTER — Other Ambulatory Visit: Payer: Self-pay | Admitting: Obstetrics and Gynecology

## 2018-05-21 DIAGNOSIS — O3680X9 Pregnancy with inconclusive fetal viability, other fetus: Secondary | ICD-10-CM | POA: Diagnosis not present

## 2018-05-21 DIAGNOSIS — O021 Missed abortion: Secondary | ICD-10-CM | POA: Diagnosis not present

## 2018-05-21 DIAGNOSIS — O034 Incomplete spontaneous abortion without complication: Secondary | ICD-10-CM | POA: Diagnosis not present

## 2018-06-08 ENCOUNTER — Encounter (HOSPITAL_COMMUNITY): Payer: Self-pay

## 2018-06-20 DIAGNOSIS — O021 Missed abortion: Secondary | ICD-10-CM | POA: Diagnosis not present

## 2018-06-20 DIAGNOSIS — Z6831 Body mass index (BMI) 31.0-31.9, adult: Secondary | ICD-10-CM | POA: Diagnosis not present

## 2018-06-24 DIAGNOSIS — J019 Acute sinusitis, unspecified: Secondary | ICD-10-CM | POA: Diagnosis not present

## 2018-06-25 ENCOUNTER — Ambulatory Visit: Payer: BLUE CROSS/BLUE SHIELD | Admitting: Family Medicine

## 2018-08-14 ENCOUNTER — Other Ambulatory Visit: Payer: Self-pay | Admitting: Family Medicine

## 2018-08-14 DIAGNOSIS — F32A Depression, unspecified: Secondary | ICD-10-CM

## 2018-08-14 DIAGNOSIS — F419 Anxiety disorder, unspecified: Principal | ICD-10-CM

## 2018-08-14 DIAGNOSIS — F329 Major depressive disorder, single episode, unspecified: Secondary | ICD-10-CM

## 2018-08-14 NOTE — Telephone Encounter (Signed)
[facility] is requesting to fill pt Xanax .

## 2018-08-28 ENCOUNTER — Ambulatory Visit: Payer: BLUE CROSS/BLUE SHIELD | Admitting: Family Medicine

## 2018-09-01 DIAGNOSIS — F411 Generalized anxiety disorder: Secondary | ICD-10-CM | POA: Diagnosis not present

## 2018-09-30 DIAGNOSIS — F411 Generalized anxiety disorder: Secondary | ICD-10-CM | POA: Diagnosis not present

## 2018-10-08 DIAGNOSIS — F411 Generalized anxiety disorder: Secondary | ICD-10-CM | POA: Diagnosis not present

## 2018-10-22 DIAGNOSIS — F411 Generalized anxiety disorder: Secondary | ICD-10-CM | POA: Diagnosis not present

## 2018-11-05 DIAGNOSIS — Z3A08 8 weeks gestation of pregnancy: Secondary | ICD-10-CM | POA: Diagnosis not present

## 2018-11-05 DIAGNOSIS — Z3201 Encounter for pregnancy test, result positive: Secondary | ICD-10-CM | POA: Diagnosis not present

## 2018-11-06 DIAGNOSIS — Z3481 Encounter for supervision of other normal pregnancy, first trimester: Secondary | ICD-10-CM | POA: Diagnosis not present

## 2018-11-28 DIAGNOSIS — Z363 Encounter for antenatal screening for malformations: Secondary | ICD-10-CM | POA: Diagnosis not present

## 2018-11-28 DIAGNOSIS — Z3A11 11 weeks gestation of pregnancy: Secondary | ICD-10-CM | POA: Diagnosis not present

## 2018-11-28 DIAGNOSIS — Z369 Encounter for antenatal screening, unspecified: Secondary | ICD-10-CM | POA: Diagnosis not present

## 2019-01-13 DIAGNOSIS — O4692 Antepartum hemorrhage, unspecified, second trimester: Secondary | ICD-10-CM | POA: Diagnosis not present

## 2019-01-13 DIAGNOSIS — O9989 Other specified diseases and conditions complicating pregnancy, childbirth and the puerperium: Secondary | ICD-10-CM | POA: Diagnosis not present

## 2019-01-13 DIAGNOSIS — O43192 Other malformation of placenta, second trimester: Secondary | ICD-10-CM | POA: Diagnosis not present

## 2019-01-13 DIAGNOSIS — O468X2 Other antepartum hemorrhage, second trimester: Secondary | ICD-10-CM | POA: Diagnosis not present

## 2019-01-13 DIAGNOSIS — O99342 Other mental disorders complicating pregnancy, second trimester: Secondary | ICD-10-CM | POA: Diagnosis not present

## 2019-01-15 DIAGNOSIS — O43192 Other malformation of placenta, second trimester: Secondary | ICD-10-CM | POA: Diagnosis not present

## 2019-01-15 DIAGNOSIS — Z3A18 18 weeks gestation of pregnancy: Secondary | ICD-10-CM | POA: Diagnosis not present

## 2019-02-13 DIAGNOSIS — O358XX1 Maternal care for other (suspected) fetal abnormality and damage, fetus 1: Secondary | ICD-10-CM | POA: Diagnosis not present

## 2019-02-13 DIAGNOSIS — Z3A22 22 weeks gestation of pregnancy: Secondary | ICD-10-CM | POA: Diagnosis not present

## 2019-02-16 DIAGNOSIS — L0591 Pilonidal cyst without abscess: Secondary | ICD-10-CM | POA: Diagnosis not present

## 2019-02-24 DIAGNOSIS — Z20828 Contact with and (suspected) exposure to other viral communicable diseases: Secondary | ICD-10-CM | POA: Diagnosis not present

## 2019-02-25 DIAGNOSIS — F411 Generalized anxiety disorder: Secondary | ICD-10-CM | POA: Diagnosis not present

## 2019-03-01 ENCOUNTER — Encounter (HOSPITAL_COMMUNITY): Payer: Self-pay

## 2019-03-03 DIAGNOSIS — L0592 Pilonidal sinus without abscess: Secondary | ICD-10-CM | POA: Diagnosis not present

## 2019-03-03 DIAGNOSIS — Z23 Encounter for immunization: Secondary | ICD-10-CM | POA: Diagnosis not present

## 2019-03-03 DIAGNOSIS — F411 Generalized anxiety disorder: Secondary | ICD-10-CM | POA: Diagnosis not present

## 2019-03-03 DIAGNOSIS — Z369 Encounter for antenatal screening, unspecified: Secondary | ICD-10-CM | POA: Diagnosis not present

## 2019-03-10 DIAGNOSIS — F411 Generalized anxiety disorder: Secondary | ICD-10-CM | POA: Diagnosis not present

## 2019-03-17 DIAGNOSIS — F411 Generalized anxiety disorder: Secondary | ICD-10-CM | POA: Diagnosis not present

## 2019-03-25 DIAGNOSIS — F411 Generalized anxiety disorder: Secondary | ICD-10-CM | POA: Diagnosis not present

## 2019-04-01 DIAGNOSIS — Z23 Encounter for immunization: Secondary | ICD-10-CM | POA: Diagnosis not present

## 2019-04-01 DIAGNOSIS — Z369 Encounter for antenatal screening, unspecified: Secondary | ICD-10-CM | POA: Diagnosis not present

## 2019-04-02 DIAGNOSIS — F411 Generalized anxiety disorder: Secondary | ICD-10-CM | POA: Diagnosis not present

## 2019-04-08 DIAGNOSIS — Z369 Encounter for antenatal screening, unspecified: Secondary | ICD-10-CM | POA: Diagnosis not present

## 2019-04-14 DIAGNOSIS — F411 Generalized anxiety disorder: Secondary | ICD-10-CM | POA: Diagnosis not present

## 2019-04-14 DIAGNOSIS — Z3A31 31 weeks gestation of pregnancy: Secondary | ICD-10-CM | POA: Diagnosis not present

## 2019-04-14 DIAGNOSIS — Z362 Encounter for other antenatal screening follow-up: Secondary | ICD-10-CM | POA: Diagnosis not present

## 2019-04-14 DIAGNOSIS — Z6833 Body mass index (BMI) 33.0-33.9, adult: Secondary | ICD-10-CM | POA: Diagnosis not present

## 2019-04-14 DIAGNOSIS — E6609 Other obesity due to excess calories: Secondary | ICD-10-CM | POA: Diagnosis not present

## 2019-04-21 DIAGNOSIS — F411 Generalized anxiety disorder: Secondary | ICD-10-CM | POA: Diagnosis not present

## 2019-04-30 DIAGNOSIS — F411 Generalized anxiety disorder: Secondary | ICD-10-CM | POA: Diagnosis not present

## 2019-05-12 DIAGNOSIS — Z8659 Personal history of other mental and behavioral disorders: Secondary | ICD-10-CM | POA: Diagnosis not present

## 2019-05-12 DIAGNOSIS — Z331 Pregnant state, incidental: Secondary | ICD-10-CM | POA: Diagnosis not present

## 2019-05-13 DIAGNOSIS — F411 Generalized anxiety disorder: Secondary | ICD-10-CM | POA: Diagnosis not present

## 2019-05-19 DIAGNOSIS — Z3483 Encounter for supervision of other normal pregnancy, third trimester: Secondary | ICD-10-CM | POA: Diagnosis not present

## 2019-05-27 DIAGNOSIS — O403XX1 Polyhydramnios, third trimester, fetus 1: Secondary | ICD-10-CM | POA: Diagnosis not present

## 2019-05-27 DIAGNOSIS — O3663X1 Maternal care for excessive fetal growth, third trimester, fetus 1: Secondary | ICD-10-CM | POA: Diagnosis not present

## 2019-05-27 DIAGNOSIS — Z3A37 37 weeks gestation of pregnancy: Secondary | ICD-10-CM | POA: Diagnosis not present

## 2019-05-29 DIAGNOSIS — F419 Anxiety disorder, unspecified: Secondary | ICD-10-CM | POA: Diagnosis not present

## 2019-06-02 DIAGNOSIS — F4320 Adjustment disorder, unspecified: Secondary | ICD-10-CM | POA: Diagnosis not present

## 2019-06-02 DIAGNOSIS — F411 Generalized anxiety disorder: Secondary | ICD-10-CM | POA: Diagnosis not present

## 2019-06-03 DIAGNOSIS — Z3A38 38 weeks gestation of pregnancy: Secondary | ICD-10-CM | POA: Diagnosis not present

## 2019-06-03 DIAGNOSIS — O403XX1 Polyhydramnios, third trimester, fetus 1: Secondary | ICD-10-CM | POA: Diagnosis not present

## 2019-06-09 DIAGNOSIS — O403XX0 Polyhydramnios, third trimester, not applicable or unspecified: Secondary | ICD-10-CM | POA: Diagnosis not present

## 2019-06-09 DIAGNOSIS — O99344 Other mental disorders complicating childbirth: Secondary | ICD-10-CM | POA: Diagnosis not present

## 2019-06-09 DIAGNOSIS — Z2882 Immunization not carried out because of caregiver refusal: Secondary | ICD-10-CM | POA: Diagnosis not present

## 2019-06-09 DIAGNOSIS — O99892 Other specified diseases and conditions complicating childbirth: Secondary | ICD-10-CM | POA: Diagnosis not present

## 2019-06-09 DIAGNOSIS — I272 Pulmonary hypertension, unspecified: Secondary | ICD-10-CM | POA: Diagnosis not present

## 2019-06-09 DIAGNOSIS — Q381 Ankyloglossia: Secondary | ICD-10-CM | POA: Diagnosis not present

## 2019-06-09 DIAGNOSIS — J111 Influenza due to unidentified influenza virus with other respiratory manifestations: Secondary | ICD-10-CM | POA: Diagnosis not present

## 2019-06-09 DIAGNOSIS — I429 Cardiomyopathy, unspecified: Secondary | ICD-10-CM | POA: Diagnosis not present

## 2019-06-09 DIAGNOSIS — R1084 Generalized abdominal pain: Secondary | ICD-10-CM | POA: Diagnosis not present

## 2019-06-09 DIAGNOSIS — O99214 Obesity complicating childbirth: Secondary | ICD-10-CM | POA: Diagnosis not present

## 2019-06-09 DIAGNOSIS — O9852 Other viral diseases complicating childbirth: Secondary | ICD-10-CM | POA: Diagnosis not present

## 2019-06-09 DIAGNOSIS — Z7982 Long term (current) use of aspirin: Secondary | ICD-10-CM | POA: Diagnosis not present

## 2019-06-09 DIAGNOSIS — M329 Systemic lupus erythematosus, unspecified: Secondary | ICD-10-CM | POA: Diagnosis not present

## 2019-06-09 DIAGNOSIS — O9952 Diseases of the respiratory system complicating childbirth: Secondary | ICD-10-CM | POA: Diagnosis not present

## 2019-06-09 DIAGNOSIS — F419 Anxiety disorder, unspecified: Secondary | ICD-10-CM | POA: Diagnosis not present

## 2019-06-09 DIAGNOSIS — O3663X0 Maternal care for excessive fetal growth, third trimester, not applicable or unspecified: Secondary | ICD-10-CM | POA: Diagnosis not present

## 2019-06-09 DIAGNOSIS — G8918 Other acute postprocedural pain: Secondary | ICD-10-CM | POA: Diagnosis not present

## 2019-06-09 DIAGNOSIS — Z3A39 39 weeks gestation of pregnancy: Secondary | ICD-10-CM | POA: Diagnosis not present

## 2019-06-10 DIAGNOSIS — Q381 Ankyloglossia: Secondary | ICD-10-CM | POA: Diagnosis not present

## 2019-06-10 DIAGNOSIS — Z2882 Immunization not carried out because of caregiver refusal: Secondary | ICD-10-CM | POA: Diagnosis not present

## 2019-06-10 DIAGNOSIS — O403XX0 Polyhydramnios, third trimester, not applicable or unspecified: Secondary | ICD-10-CM | POA: Diagnosis not present

## 2019-06-10 DIAGNOSIS — Z3A39 39 weeks gestation of pregnancy: Secondary | ICD-10-CM | POA: Diagnosis not present

## 2019-06-11 DIAGNOSIS — Q381 Ankyloglossia: Secondary | ICD-10-CM | POA: Diagnosis not present

## 2019-06-11 DIAGNOSIS — R1084 Generalized abdominal pain: Secondary | ICD-10-CM | POA: Diagnosis not present

## 2019-06-11 DIAGNOSIS — O403XX0 Polyhydramnios, third trimester, not applicable or unspecified: Secondary | ICD-10-CM | POA: Diagnosis not present

## 2019-06-11 DIAGNOSIS — G8918 Other acute postprocedural pain: Secondary | ICD-10-CM | POA: Diagnosis not present

## 2019-06-12 DIAGNOSIS — Q381 Ankyloglossia: Secondary | ICD-10-CM | POA: Diagnosis not present

## 2019-07-06 DIAGNOSIS — F411 Generalized anxiety disorder: Secondary | ICD-10-CM | POA: Diagnosis not present

## 2019-07-07 DIAGNOSIS — F439 Reaction to severe stress, unspecified: Secondary | ICD-10-CM | POA: Diagnosis not present

## 2019-08-17 DIAGNOSIS — F411 Generalized anxiety disorder: Secondary | ICD-10-CM | POA: Diagnosis not present

## 2019-08-31 DIAGNOSIS — F411 Generalized anxiety disorder: Secondary | ICD-10-CM | POA: Diagnosis not present

## 2019-10-15 DIAGNOSIS — F411 Generalized anxiety disorder: Secondary | ICD-10-CM | POA: Diagnosis not present

## 2019-10-29 DIAGNOSIS — F411 Generalized anxiety disorder: Secondary | ICD-10-CM | POA: Diagnosis not present

## 2019-11-11 DIAGNOSIS — F411 Generalized anxiety disorder: Secondary | ICD-10-CM | POA: Diagnosis not present

## 2019-12-08 DIAGNOSIS — F411 Generalized anxiety disorder: Secondary | ICD-10-CM | POA: Diagnosis not present

## 2020-01-18 DIAGNOSIS — F411 Generalized anxiety disorder: Secondary | ICD-10-CM | POA: Diagnosis not present

## 2020-02-02 DIAGNOSIS — F329 Major depressive disorder, single episode, unspecified: Secondary | ICD-10-CM | POA: Diagnosis not present

## 2020-02-02 DIAGNOSIS — F419 Anxiety disorder, unspecified: Secondary | ICD-10-CM | POA: Diagnosis not present

## 2020-02-24 DIAGNOSIS — R1084 Generalized abdominal pain: Secondary | ICD-10-CM | POA: Diagnosis not present

## 2020-02-24 DIAGNOSIS — R197 Diarrhea, unspecified: Secondary | ICD-10-CM | POA: Diagnosis not present

## 2020-02-24 IMAGING — US US OB TRANSVAGINAL
1 series · 15 of 28 positions shown · non-contrast
Comparison: None.

CLINICAL DATA: Vaginal bleeding, gestational age by last menstrual
period 11 weeks and 0 days.

EXAM:
TRANSVAGINAL OB ULTRASOUND
TECHNIQUE: Transvaginal ultrasound was performed for complete evaluation of the
gestation as well as the maternal uterus, adnexal regions, and
pelvic cul-de-sac.

[Series 1: us ob transvaginal · 15 of 31 slices shown]
[im 1/31]
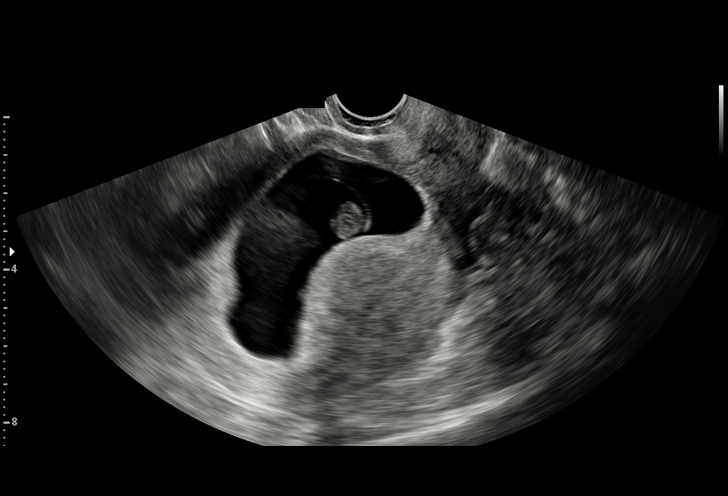
[im 3/31]
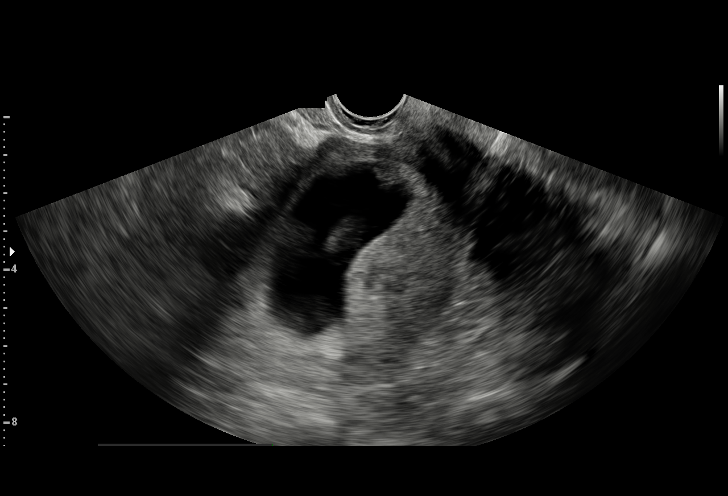
[im 5/31]
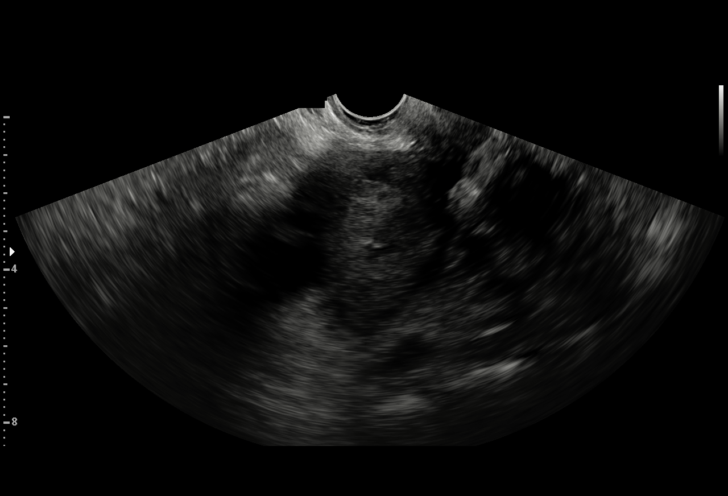
[im 7/31]
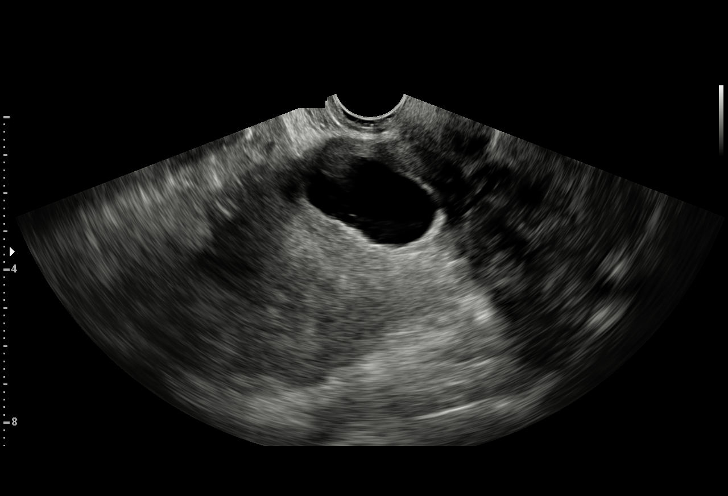
[im 9/31]
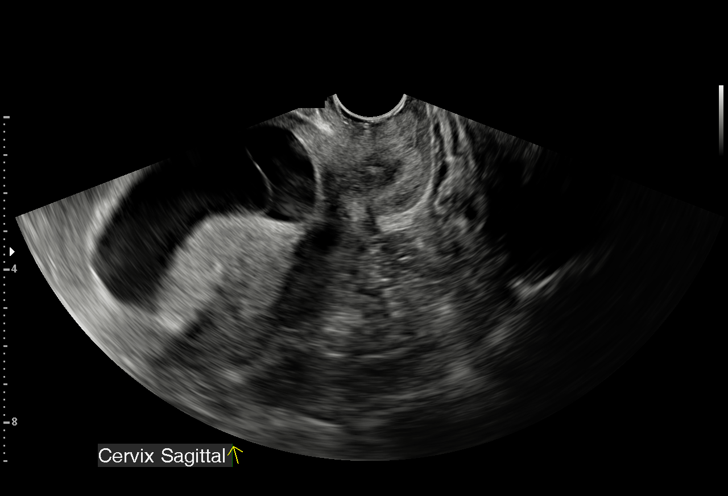
[im 12/31]
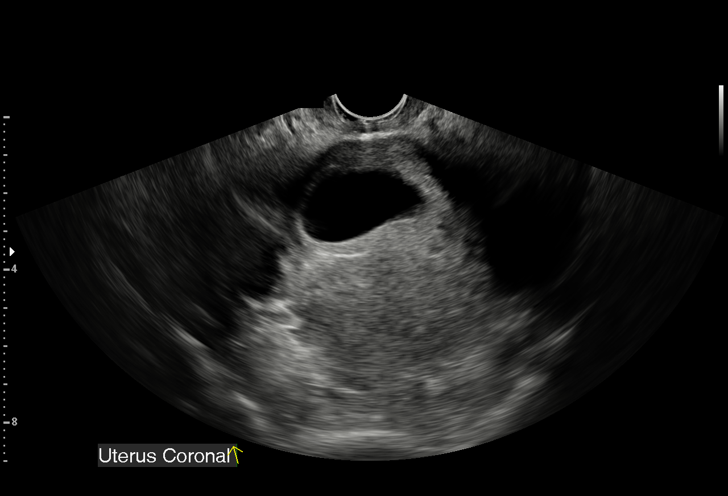
[im 14/31]
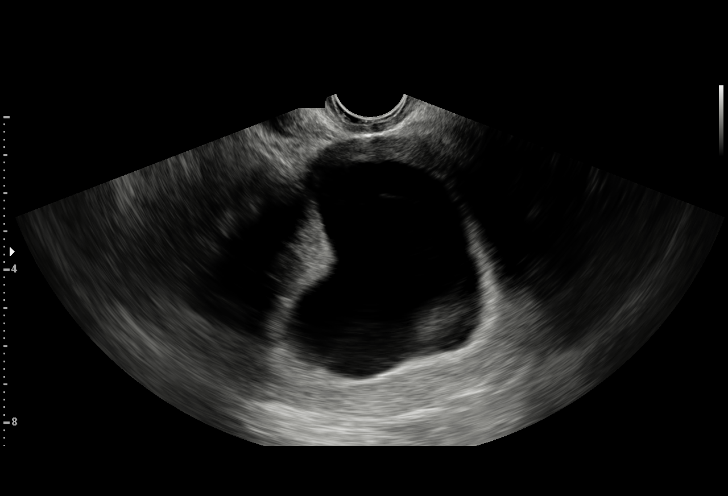
[im 16/31]
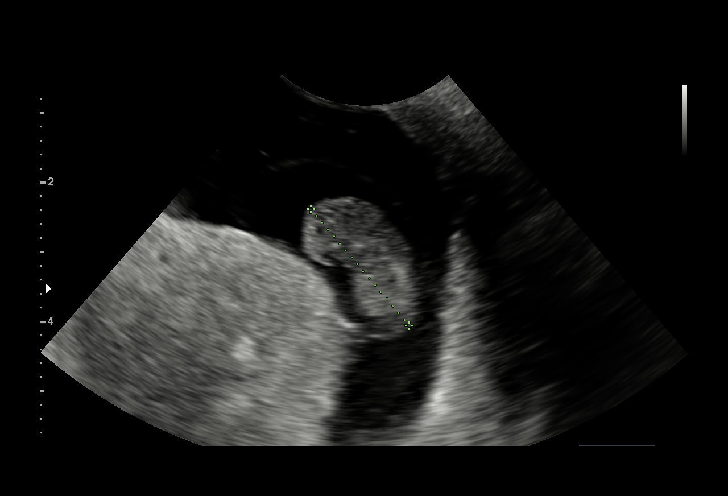
[im 17/31]
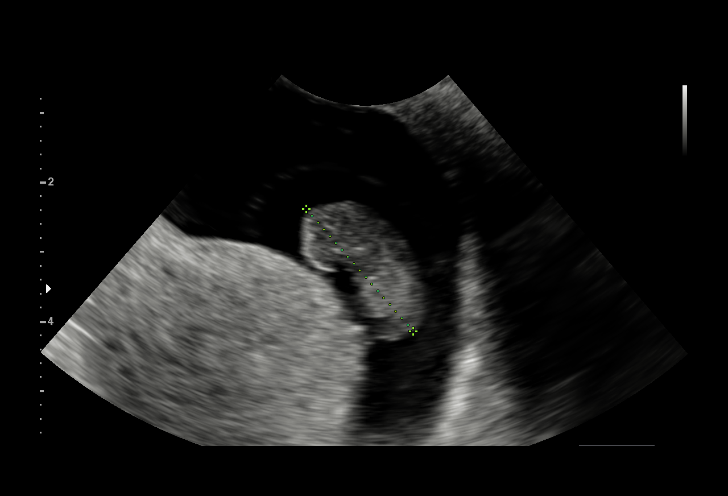
[im 19/31]
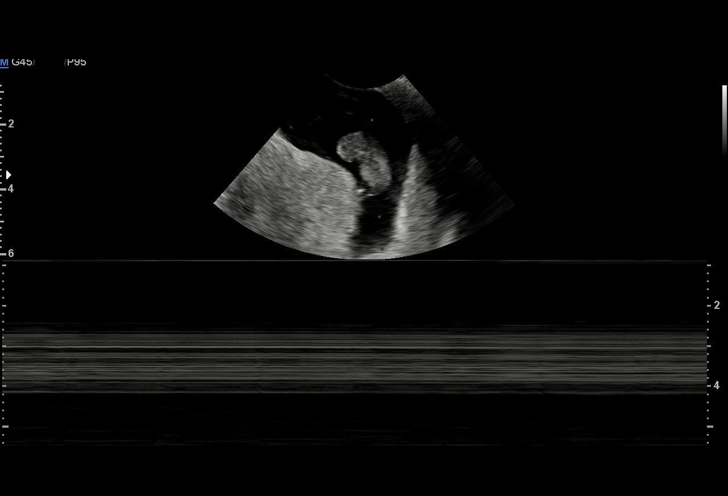
[im 22/31]
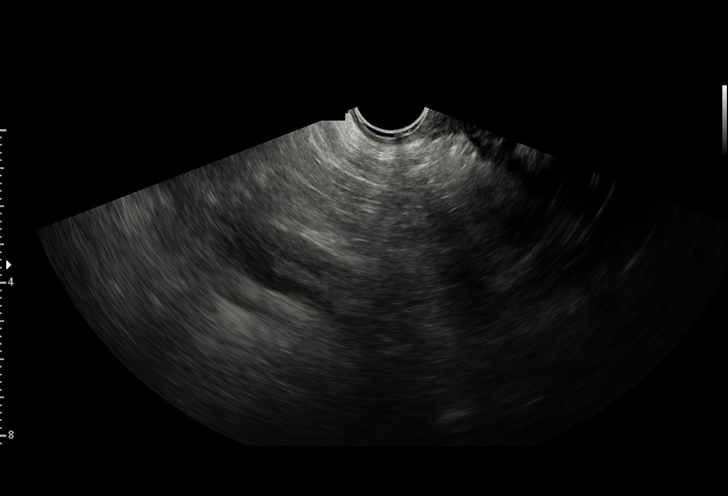
[im 24/31]
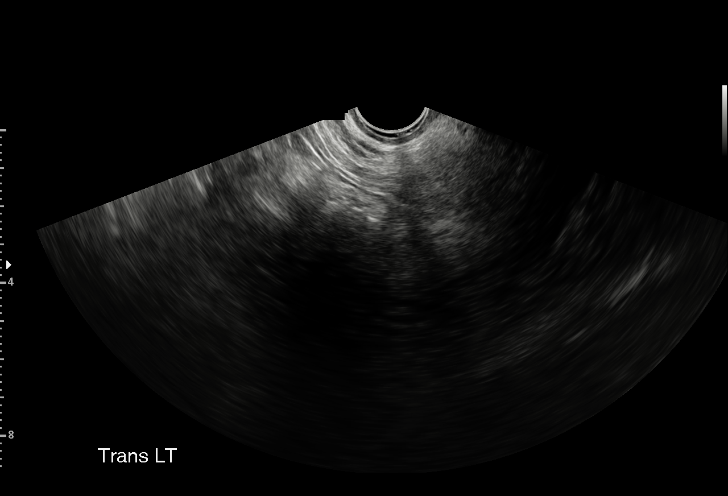
[im 26/31]
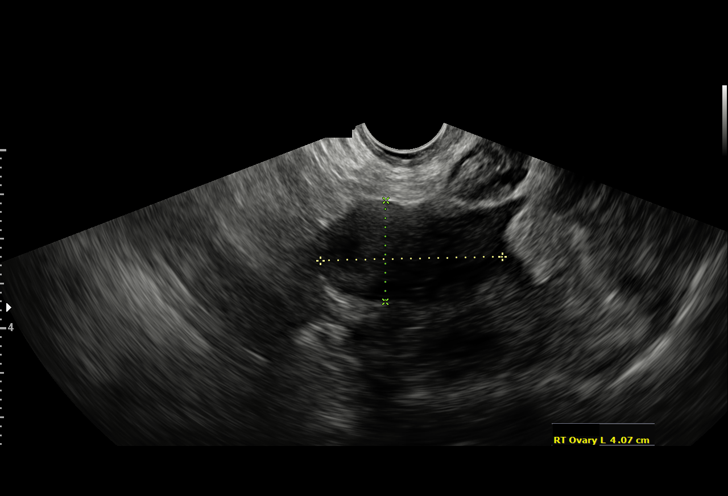
[im 28/31]
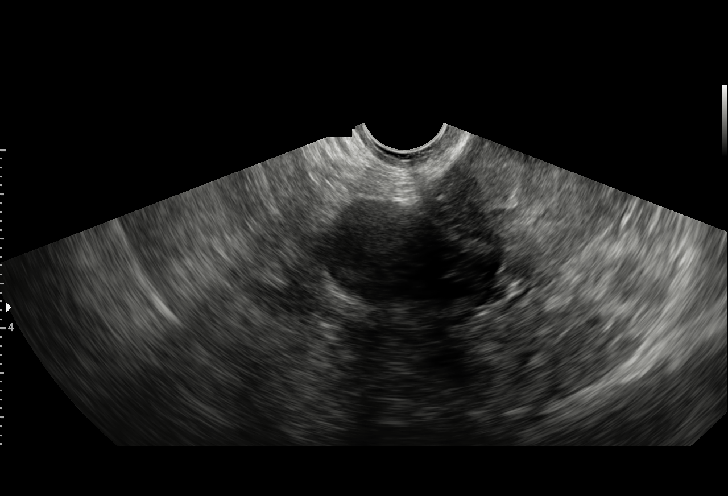
[im 31/31]
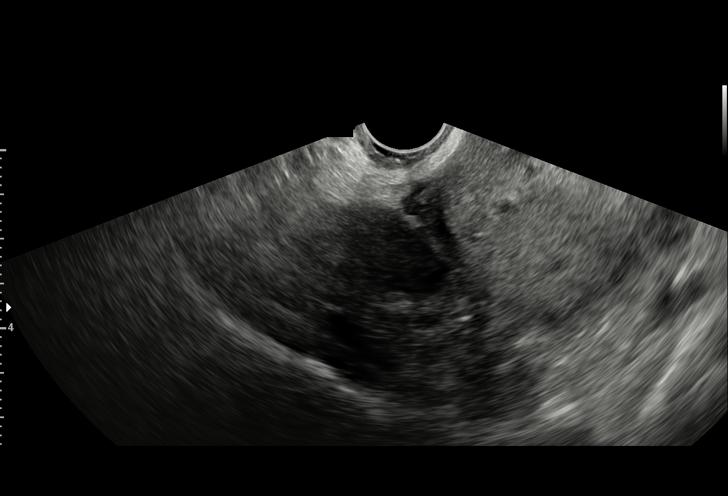

[15 of 28 positions shown; findings below may reference images not displayed]

FINDINGS: Intrauterine gestational sac: Present.

Yolk sac:  Not present.

Embryo:  Present.

Cardiac Activity: Not present.

CRL: 22.6 mm   8 w 6 d

Subchorionic hemorrhage:  None visualized.

Maternal uterus/adnexae: LEFT ovary not sonographically identified.
Normal appearance of the RIGHT adnexa. No free fluid.
IMPRESSION: Findings meet definitive criteria for nonviable pregnancy. This
follows SRU consensus guidelines: Diagnostic Criteria for Nonviable
Pregnancy Early in the First Trimester. N Engl J Med
9192;[DATE].

Acute findings discussed with and reconfirmed by SHALLEY JIM on
05/19/2018 at [DATE].

## 2020-02-25 DIAGNOSIS — F411 Generalized anxiety disorder: Secondary | ICD-10-CM | POA: Diagnosis not present

## 2020-02-26 DIAGNOSIS — G8929 Other chronic pain: Secondary | ICD-10-CM | POA: Diagnosis not present

## 2020-02-26 DIAGNOSIS — M545 Low back pain: Secondary | ICD-10-CM | POA: Diagnosis not present

## 2020-03-10 DIAGNOSIS — F411 Generalized anxiety disorder: Secondary | ICD-10-CM | POA: Diagnosis not present

## 2020-03-15 DIAGNOSIS — M546 Pain in thoracic spine: Secondary | ICD-10-CM | POA: Diagnosis not present

## 2020-03-15 DIAGNOSIS — M6281 Muscle weakness (generalized): Secondary | ICD-10-CM | POA: Diagnosis not present

## 2020-03-15 DIAGNOSIS — M2560 Stiffness of unspecified joint, not elsewhere classified: Secondary | ICD-10-CM | POA: Diagnosis not present

## 2020-03-24 DIAGNOSIS — F411 Generalized anxiety disorder: Secondary | ICD-10-CM | POA: Diagnosis not present

## 2020-03-29 DIAGNOSIS — M2560 Stiffness of unspecified joint, not elsewhere classified: Secondary | ICD-10-CM | POA: Diagnosis not present

## 2020-03-29 DIAGNOSIS — M546 Pain in thoracic spine: Secondary | ICD-10-CM | POA: Diagnosis not present

## 2020-03-29 DIAGNOSIS — M6281 Muscle weakness (generalized): Secondary | ICD-10-CM | POA: Diagnosis not present

## 2020-04-21 DIAGNOSIS — F411 Generalized anxiety disorder: Secondary | ICD-10-CM | POA: Diagnosis not present

## 2022-04-25 ENCOUNTER — Encounter: Payer: Self-pay | Admitting: Internal Medicine

## 2022-07-14 ENCOUNTER — Other Ambulatory Visit
Admission: RE | Admit: 2022-07-14 | Discharge: 2022-07-14 | Disposition: A | Discharge status: Home / Self Care | Payer: No Typology Code available for payment source | Source: Ambulatory Visit | Attending: Cardiology | Admitting: Cardiology

## 2022-07-14 DIAGNOSIS — F329 Major depressive disorder, single episode, unspecified: Secondary | ICD-10-CM | POA: Insufficient documentation

## 2022-07-14 DIAGNOSIS — Z13220 Encounter for screening for lipoid disorders: Secondary | ICD-10-CM | POA: Insufficient documentation

## 2022-07-14 DIAGNOSIS — Z131 Encounter for screening for diabetes mellitus: Secondary | ICD-10-CM | POA: Insufficient documentation

## 2022-07-14 LAB — LIPID PANEL
Chol/HDL Ratio: 3.8
Cholesterol: 198 mg/dL
HDL: 52 mg/dL (ref 40–60)
LDL Calculated: 119 mg/dL
Non HDL Cholesterol: 146 mg/dL
Triglycerides: 137 mg/dL

## 2022-07-14 LAB — BASIC METABOLIC PANEL
Anion Gap: 11 (ref 7–16)
CO2: 26 mmol/L (ref 20–28)
Calcium: 9.5 mg/dL (ref 8.8–10.2)
Chloride: 104 mmol/L (ref 96–108)
Creatinine: 0.84 mg/dL (ref 0.51–0.95)
Glucose: 92 mg/dL (ref 60–99)
Lab: 14 mg/dL (ref 6–20)
Potassium: 4.6 mmol/L (ref 3.3–5.1)
Sodium: 141 mmol/L (ref 133–145)
eGFR BY CREAT: 92 *

## 2022-07-14 LAB — T4, FREE: Free T4: 1 ng/dL (ref 0.9–1.7)

## 2022-07-14 LAB — TSH: TSH: 0.97 u[IU]/mL (ref 0.27–4.20)

## 2022-07-15 LAB — HEMOGLOBIN A1C: Hemoglobin A1C: 5 %

## 2022-09-04 ENCOUNTER — Telehealth: Payer: Self-pay | Admitting: Obstetrics and Gynecology

## 2022-09-04 NOTE — Telephone Encounter (Signed)
Called patient left VM regarding her appt on 4/10. This needs to be rescheduled as provider will not be in clinic on this date.

## 2022-09-19 ENCOUNTER — Ambulatory Visit: Payer: Self-pay | Admitting: Obstetrics and Gynecology

## 2022-12-06 ENCOUNTER — Other Ambulatory Visit
Admission: RE | Admit: 2022-12-06 | Discharge: 2022-12-06 | Disposition: A | Discharge status: Home / Self Care | Payer: No Typology Code available for payment source | Source: Ambulatory Visit | Attending: Family | Admitting: Family

## 2022-12-06 DIAGNOSIS — Z0189 Encounter for other specified special examinations: Secondary | ICD-10-CM | POA: Insufficient documentation

## 2022-12-06 LAB — URINE MICROSCOPIC (IQ200)
Bacteria,UA: NONE SEEN
RBC,UA: >50 /hpf — AB (ref 0–2)

## 2022-12-06 LAB — URINALYSIS REFLEX TO CULTURE
Glucose,UA: NEGATIVE
Ketones, UA: NEGATIVE
Nitrite,UA: POSITIVE — AB
Specific Gravity,UA: 1.019 (ref 1.002–1.030)
pH,UA: 6 (ref 5.0–8.0)

## 2022-12-06 LAB — AEROBIC CULTURE: Aerobic Culture: 0

## 2023-04-03 ENCOUNTER — Ambulatory Visit: Payer: Self-pay | Admitting: Obstetrics and Gynecology

## 2023-04-18 ENCOUNTER — Other Ambulatory Visit: Payer: Self-pay

## 2023-04-18 ENCOUNTER — Encounter: Payer: Self-pay | Admitting: Obstetrics and Gynecology

## 2023-04-18 ENCOUNTER — Ambulatory Visit
Payer: No Typology Code available for payment source | Attending: Obstetrics and Gynecology | Admitting: Obstetrics and Gynecology

## 2023-04-18 VITALS — BP 100/62 | HR 69 | Temp 97.7°F | Wt 220.0 lb

## 2023-04-18 DIAGNOSIS — Z124 Encounter for screening for malignant neoplasm of cervix: Secondary | ICD-10-CM | POA: Insufficient documentation

## 2023-04-18 DIAGNOSIS — Z01419 Encounter for gynecological examination (general) (routine) without abnormal findings: Secondary | ICD-10-CM | POA: Insufficient documentation

## 2023-04-18 DIAGNOSIS — B977 Papillomavirus as the cause of diseases classified elsewhere: Secondary | ICD-10-CM | POA: Insufficient documentation

## 2023-04-18 NOTE — Progress Notes (Signed)
[facility] Routine Annual Well-Woman Visit  Location: [facility]     History of Present Illness:  Regina Hull is a 37 y.o. new GYN pt of ours who presents for their routine annual GYN exam today.  Patient is premenopausal with Patient's last menstrual period was 03/14/2023 (approximate).  Patient reports no concerns today. She moved here from [place] and needs to establish care     Routine Screenings  Last annual GYN exam: Patient states 2022 in [place]   Last PAP: 2022  History of abnormal PAP: Yes, patient reports + HPV   HPV vaccine status: Not received   Last MMG: Never   Last Colonoscopy: Never  Last DEXA scan: Never   Current birth control method: None, she is OK with a pregnancy     Menstrual frequency: Regular, monthly   Menstrual concerns or changes: Since C/S in 2020, cycles have been much more painful. She uses heating pads and ibuprofen for management     Plans for pregnancy: Not actively TTC, but OK with a pregnancy   Partner preference: Female  Sexually active: Yes  Condom use: No  STI testing offered and pt declines    Breast changes: No  Performs breast self exam: No    Review of Systems  Pertinent positives are noted in the HPI.    Gynecologic & Sexual History  Menstrual Status: Premenopausal    Age of Menarche: 14    Menses: Regular    Pap History: History of abnormal    Sexually Transmitted Infection History: HPV    HPV Vaccine Series: Not received       Wellness & Safety  Has a PCP: Yes   Dentist in the last year: Yes   Wears seatbelts: Yes  Exercise: Yes, walking   Feel safe at home: Yes          No data to display                  Past Medical History:   Diagnosis Date    Depression        Past Surgical History:   Procedure Laterality Date    CESAREAN SECTION, UNSPECIFIED      December 2020       Family History   Problem Relation Age of Onset    Ovarian cancer Maternal Aunt         Passed in her 26s    Breast cancer Neg Hx     Colon cancer Neg Hx        OB  History   Gravida Para Term Preterm AB Living   3 1 1   2      SAB IAB Ectopic Multiple Live Births   2              # Outcome Date GA Lbr Len/2nd Weight Sex Type Anes PTL Lv   3 Term 05/2019    Judie Petit CS-LTranv      2 SAB 05/2018           1 SAB 2008               No Known Allergies (drug, envir, food or latex)    Prior to Admission medications    Medication Sig Start Date End Date Taking? Authorizing Provider   buPROPion 150 mg 12 hr tablet Take 1 tablet (150 mg total) by mouth 2 times daily. Swallow whole. Do not crush, break, or chew.   Yes [provider]       Social History     Socioeconomic History    Marital status: Married   Tobacco Use    Smoking status: Former     Types: Cigarettes     Quit date: 2017     Years since quitting: 7.8    Smokeless tobacco: Never   Substance and Sexual Activity    Alcohol use: Yes     Comment: occasionally    Drug use: Never    Sexual activity: Yes     Partners: Male     Birth control/protection: None       Physical Exam  BP 100/62   Pulse 69   Temp 36.5 C (97.7 F)   Wt 99.8 kg (220 lb)   LMP 03/14/2023 (Approximate)     General appearance: Alert, oriented and in no acute distress.    Head: Normocephalic, atraumatic.  Neck: Thyroid symmetric, normal in size without nodules or tenderness. Non-tender and no supraclavicular or cervical lymphadenopathy.  Respiratory: Unlabored effort.Lungs clear bilaterally   Cardiac: Normal heart sound. Rhythm regular   Breasts: Breast examination was performed in the supine position. Bilaterally, normal shape and contour without palpable masses/nodules. No visible skin changes, texture changes, redness or lesions. Nipples are normal and everted without discharge. No axillary lymphadenopathy.  Abdomen: Soft, non-tender, non-distended. No palpable masses. No inguinal lymphadenopathy.  Pelvic Exam:  Vulva: Normal in appearance and without suspicious lesions or pigment changes.  Bladder: Urethral meatus normal, no discharge, no prolapse,  and non-tender.  Vagina: Well rugated, pink, well moisturized, no abnormal vaginal discharge, and no lesions.  Cervix: Smooth, pink, no abnormal discharge and negative CMT.  Uterus: Normal size, shape, contour, +mobile, and non-tender.  Adnexae:  Right No palpable masses or fullness. Non-tender.  Left:  No palpable masses or fullness. Non-tender.  Perineum: Normal. No suspicious lesions.  Pelvic floor: Non-tender.     Exam Chaperone: Misty     Utilizing a shared decision model, a pelvic exam was performed today as indicated by screening recommendations, medical history and/or symptoms.      Assessment & Plan  This is a 37 y.o. G3P1020 premenopausal patient here for routine annual GYN exam.      Routine Screenings:  - PAP: Collected today  - HPV vaccine status: Not received. Counseled and she will consider   - MMG: Due for routine screening at age 37   - Colonoscopy: Due for routine screening at age 37  - Dexa/BMD: Not indicated   - STI testing: Declines   - Lipid profile: Defer to PCP   - Diabetes screen: Defer to PCP     Problems discussed today:  - Dysmenorrhea: We reviewed that first line of treatment for heavy periods is contraception. We also discussed the use of NSAIDs (if they are safe for her to take) a few days prior to the start of menses to prevent prostaglandin activity. Discussed research supporting supplementation w/ magnesium, vitamin B6, and zinc to help w/ dysmenorrhea. She declines contraception at this time as she has not reacted well to it in the past. We discussed that there are many formulations and routes of birth control she may trial if she is open to it. She would like to try supplementation first, but is aware she can return to the office if she changes her mind.      Family planning  - Reproductive goals were discussed and she is OK with a future pregnancy occurring   -  Contraception plan is: None    Education Reviewed:  - Discussed the importance of breast self-awareness. Patient  encouraged to report any changes in the way their breasts look and/or feel to their medical care providers.  - Discussed that, aside from abstinence, condoms are the only method of protection against sexual transmitted infections.      1. Well woman exam with routine gynecological exam        2. Pap smear for cervical cancer screening  GYN Cytology      3. HPV (human papilloma virus) infection          Follow up for as needed for GYN concerns or annual exam.    Margie Ege, NP  Signed 04/18/2023 at 1:55 PM

## 2023-04-18 NOTE — Patient Instructions (Signed)
Magnesium (glycinate recommended) 400mg    Vitamin B6- daily value  Zinc- daily value

## 2023-04-29 LAB — GYN CYTOLOGY

## 2023-04-30 ENCOUNTER — Ambulatory Visit: Payer: No Typology Code available for payment source | Attending: Emergency Medicine | Admitting: Emergency Medicine

## 2023-04-30 ENCOUNTER — Ambulatory Visit
Admission: RE | Admit: 2023-04-30 | Discharge: 2023-04-30 | Disposition: A | Discharge status: Home / Self Care | Payer: No Typology Code available for payment source | Source: Ambulatory Visit

## 2023-04-30 VITALS — BP 127/82 | HR 69 | Temp 97.0°F | Resp 16

## 2023-04-30 DIAGNOSIS — M79631 Pain in right forearm: Secondary | ICD-10-CM | POA: Insufficient documentation

## 2023-04-30 DIAGNOSIS — S59911A Unspecified injury of right forearm, initial encounter: Secondary | ICD-10-CM | POA: Insufficient documentation

## 2023-04-30 DIAGNOSIS — W108XXA Fall (on) (from) other stairs and steps, initial encounter: Secondary | ICD-10-CM | POA: Insufficient documentation

## 2023-04-30 NOTE — Patient Instructions (Signed)
Xray appears normal (no broken bones).  Xrays do not show soft tissue injuries.     Home care instructions:    Activity as tolerated.     Ice - apply 20 minutes on/20 minutes off to reduce swelling  Elevation - elevate above the level of the heart to reduce swelling    Tylenol (acetaminophen) every 4-6 hours and/or Motrin (ibuprofen) every 6-8 hours for pain if you can normally take these medications    Most minor injuries will improve in 1-2 weeks. Please follow up if you are not improving in the next week.   Orthopedics [phone]    Please return or seek prompt follow up for any new, worsening or not improving symptoms.

## 2023-04-30 NOTE — [facility] Provider Note (Signed)
SUBJECTIVE    CHIEF COMPLAINT:   Chief Complaint   Patient presents with    Arm Injury     Pt slipped down the stairs and landed on R arm 2 hours ago. Endorses tingling/coldness in extremity/dec ROM  Nausea/dizziness at the time of injury.        HPI:   Regina Hull is a 37 y.o. female who presents complaining of fall injury to the right forearm x 2 hours ago ago.  She slipped on the steps and landed directly on the arm just below the elbow. States initially the pain was so severe it made her feel nauseous and dizzy and she experienced a transient LOC. She did not hit her head or sustain any other injuries from the fall. Pain improved with rest, states just feels sore now.  Other treatment attempted: none. No h/o same in the past. Denies f/c, numbness, tingling, weakness, open wound, other acute injuries.  Initially after the fall she felt like her hand was cold but this has improved.    History provided by patient      OBJECTIVE    BP 127/82   Pulse 69   Temp 36.1 C (97 F) (Temporal)   Resp 16   LMP 03/14/2023 (Approximate)   SpO2 99%     General: well-appearing female, NAD, pleasant  Head: NCAT  EENT: eyes with PERLL, extraocular movements intact without pain   Neck: Supple, trachea midline  Lungs: normal respiratory effort  Neuro: Alert, oriented; no focal deficits noted  Skin: skin warm and dry, no rashes noted   Psych: no anxiety, affect is appropriate    MSK: Right shoulder and clavicle are nontender with normal range of motion.  Humerus is nontender.  Elbow is nontender with full range of motion.  Forearm is nontender but she reports pain in the soft tissue of the proximal forearm.  Right wrist is nontender with full range of motion.  Radial and ulnar pulses are 2+.  Grasp is 5 out of 5.  Hand is warm to touch with intact sensation to light touch and pressure and capillary refill less than 2 seconds.  C-spine is nontender.    ASSESSMENT & PLAN    Regina Hull is a 37 y.o. female presenting for right  forearm pain s/p fall.    Differential: Fracture, sprain, strain, contusion, arthritis, tendonitis, ulnar neuritis    Orders Placed This Encounter    * Forearm RIGHT standard AP and Lateral views     * Forearm RIGHT standard AP and Lateral views    Result Date: 04/30/2023  04/30/2023 5:31 PM RIGHT FOREARM X-RAYS CLINICAL INFORMATION:  pain post fall, S59.911A-Unspecified injury of right forearm, initial encounter. COMPARISON:  None. PROCEDURE: Two projections of the right forearm were obtained. FINDINGS/    No acute fracture or dislocation.  END OF IMPRESSION       [facility] submits this DICOM format image data and final report to the [facility], an independent secure electronic health information exchange, on a reciprocally searchable basis (with patient authorization) for a minimum of 12 months after exam date.    Personally reviewed the x-ray and agree no acute findings.  Her symptoms continue to improve with time.  There may have been some ulnar nerve irritation from the initial impact that caused her more severe symptoms that have rapidly improved.  She did have this very transient see with prodrome from the pain.  She currently feels well.    1. Injury of  right forearm, initial encounter  * Forearm RIGHT standard AP and Lateral views            Patient verbalized understanding of information presented,and confirmed agreement with current plan of care. Reviewed risks, benefits, and administration of prescribed/refilled medications. Precautions reviewed.  Encouraged to contact me / office with any questions or concerns.      There are no Patient Instructions on file for this visit.

## 2024-04-01 ENCOUNTER — Other Ambulatory Visit
Admission: RE | Admit: 2024-04-01 | Discharge: 2024-04-01 | Disposition: A | Discharge status: Home / Self Care | Source: Ambulatory Visit

## 2024-04-01 DIAGNOSIS — R3915 Urgency of urination: Secondary | ICD-10-CM | POA: Insufficient documentation

## 2024-04-01 DIAGNOSIS — R300 Dysuria: Secondary | ICD-10-CM | POA: Insufficient documentation

## 2024-04-01 DIAGNOSIS — R1033 Periumbilical pain: Secondary | ICD-10-CM | POA: Insufficient documentation

## 2024-04-01 LAB — URINALYSIS WITH REFLEX TO CULTURE
Glucose,UA: NEGATIVE
Ketones, UA: NEGATIVE
Nitrite,UA: POSITIVE — AB
Specific Gravity,UA: 1.021 (ref 1.002–1.030)
pH,UA: 6.5 (ref 5.0–8.0)

## 2024-04-01 LAB — URINE MICROSCOPIC (IQ200)
RBC,UA: >50 /HPF — AB (ref 0–2)
WBC,UA: >50 /HPF — AB (ref 0–5)

## 2024-04-03 LAB — AEROBIC BACTERIAL URINE CULTURE

## 2024-05-30 ENCOUNTER — Inpatient Hospital Stay: Admit: 2024-05-30 | Discharge: 2024-05-30 | Disposition: A | Discharge status: Home / Self Care | Payer: Self-pay

## 2024-06-01 ENCOUNTER — Other Ambulatory Visit: Payer: Self-pay

## 2024-06-01 ENCOUNTER — Encounter: Payer: Self-pay | Admitting: Orthopedic Surgery

## 2024-06-01 ENCOUNTER — Ambulatory Visit: Attending: Orthopedic Surgery | Admitting: Orthopedic Surgery

## 2024-06-01 VITALS — Ht 69.0 in | Wt 200.0 lb

## 2024-06-01 DIAGNOSIS — S52552A Other extraarticular fracture of lower end of left radius, initial encounter for closed fracture: Secondary | ICD-10-CM

## 2024-06-01 NOTE — Procedures (Signed)
 Cast/Splint application Date/Time: 06/01/2024 10:40 AM Performed by: Viktoria Garre, RN Authorized by: Jiles Agent, PA Consent given by: patientTimeout: Immediately prior to procedure a time out was called to verify the correct patient, procedure, equipment, support staff and site/side marked as requiredInjuryLocation details: right wristProcedureImmobilization: castCast type: short armSupplies used: waterproof, skin protective strip and fiberglassFiberglass quantity of rolls: 2Waterproof quantity of rolls: 2Post-procedure assessmentPatient tolerance: patient tolerated the procedure well with no immediate complicationsCommentsShort arm gortex. Skin was clean/dry/intact prior to casting. Extra padding on boney prominences as needed. Pt comfortable after application of cast. Educated on cast care instructions and advice for life with a cast. Educated that with the padding and given activity level that they may need to return if cast gets loose over next 2-4 weeks. Verbally reinforced education on cast instructions and cast care. Reinforced to contact/call the clinic with any and all concerns related to the injury or cast. DME dispensed: no DME today

## 2024-06-01 NOTE — Progress Notes (Signed)
 CHIEF COMPLAINT: Left wrist injury 12/20/2025HISTORY OF PRESENT ILLNESS: Patient is a pleasant 38 year old right-hand-dominant work from [employer] who presents for evaluation of her left wrist.  She states that she took a fall landing on an outstretched wrist, she had pain and swelling, she went to urgent care and had x-rays, there was concern of a fracture and then another report was read as showing no fracture.  She was placed in a wrist brace which provide some support but overall is not very comfortable.  She does take care of her 34-year-old sonPAST MEDICAL HISTORY: Please see reviewed problem listPAST SURGICAL HISTORY: Please see reviewed surgical historyMEDICATIONS:  See Medication ListALLERGIES:  See Allergy ListPERSONAL HISTORY: Does care for her 4-year-old sonSOCIAL HISTORY:  See social historyFAMILY HISTORY: Please see reviewed family historyREVIEW OF SYSTEMS:  Review of Gastointestinal, Genitourinary, Neurologic, Integument, Vascular, Hematologic, Lymphatic, Cardiac, Pulmonary and Endocrine systems reveal the following: The review of systems has been reviewed and is available on the scanned intake form. Positive responses are noted if pertinent.PHYSICAL EXAM: General Exam reveals a well-appearing age-appropriate female sitting comfortably in the exam room.  The left wrist shows soft tissue swelling overlying the distal radius, reproducible tenderness of the radius dorsal, radial, and volar approach.  The snuffbox, carpus, DRUJ, TFCC are nontender.  She has limited range of motion in each direction, skin is intact, neurovascularly intactIMAGING: I personally reviewed the following images; x-rays of the wrist are uploaded and reviewed, there is a subtle nondisplaced transverse distal radius fracture, ulnar positive variant. ASSESSMENT AND DIAGNOSIS: Nondisplaced transverse distal radius fracturePLAN: Patient is counseled regarding my findings,  she is placed on a short arm fiberglass cast with an interosseous mold, she is counseled on using ice, anti-inflammatories, and Tylenol.  She will avoid weightbearing, she will follow-up in about 10 days for x-rays of the wrist in the cast unless it is loose or damaged in which case it may be removed prior to x-ray.SUPERVISING PHYSICIAN: Dr. Shepard TODAY: Short arm castORDERS PLACED FOR NEXT VISIT: Wrist x-rayPERCENT OF TEMPORARY IMPAIRMENT:The above document was generated using voice recognition software. Reasonable attempts at correction were made. Please excuse any unintended transcription errors.

## 2024-06-02 ENCOUNTER — Other Ambulatory Visit: Payer: Self-pay

## 2024-06-11 ENCOUNTER — Other Ambulatory Visit: Payer: Self-pay

## 2024-06-12 ENCOUNTER — Encounter: Payer: Self-pay | Admitting: Orthopedic Surgery

## 2024-06-12 ENCOUNTER — Ambulatory Visit
Admission: RE | Admit: 2024-06-12 | Discharge: 2024-06-12 | Disposition: A | Discharge status: Home / Self Care | Source: Ambulatory Visit | Admitting: Radiology

## 2024-06-12 ENCOUNTER — Ambulatory Visit: Attending: Orthopedic Surgery | Admitting: Orthopedic Surgery

## 2024-06-12 VITALS — BP 128/68 | HR 64 | Ht 69.0 in | Wt 200.0 lb

## 2024-06-12 DIAGNOSIS — M25532 Pain in left wrist: Secondary | ICD-10-CM

## 2024-06-12 DIAGNOSIS — S52552A Other extraarticular fracture of lower end of left radius, initial encounter for closed fracture: Secondary | ICD-10-CM

## 2024-06-12 NOTE — Progress Notes (Signed)
 CHIEF COMPLAINT: Follow-up left distal radius fracture 12/20/2025INTERVAL HISTORY: Patient is 13 days out from injury, the cast is loose but otherwise only mild pain, no secondary injuries.  With the cast off she does have pain across the area of the radius.PFSH:  Since last visit no change.ROS:  Since last visit no change.MEDICATION:  Since last visit no new meds.ALLERGIES:  As per initial evaluation.PHYSICAL EXAM: Cast is removed, skin is intact, minimal swelling, there is tenderness across the radius dorsally.  Neurovascularly intact.IMAGING: I personally reviewed the following images; x-ray confirms nondisplaced transverse distal radius fractureASSESSMENT: 13 days status post distal radius fracture, loose castPLAN: Patient is placed back in a new well-fitting short arm fiberglass cast, continue to avoid heavy lifting and carrying etc., follow-up in 3 to 4 weeks for cast off and x-ray of the wrist.SUPERVISING PHYSICIAN: Dr. Shepard TODAY: Short arm castORDERS PLACED FOR NEXT VISIT: Cast off, wrist x-rayPERCENT OF TEMPORARY IMPAIRMENT:The above document was generated using voice recognition software. Reasonable attempts at correction were made. Please excuse any unintended transcription errors.Answers submitted by the patient for this visit:FOLLOW UP VISIT on 06/12/2024  8:40 AM with Regina Hull, PALeft wrist (Submitted on 06/11/2024)Chief Complaint: Left Wrist PainWhat is your goal for today's visit?: Check upHandedness: Right HandedDate of onset (if unknown, leave blank):: 12/20/2025Was this the result of an injury?: YesWhat is your current pain level?: 5/10Please describe the quality of your pain: : aching, burning, discomfort, dull ache, fullness, tendernessWhat diagnostic workup have you had for this condition?: X-rayWhat treatments have you tried for this condition?: acetaminophen, activity modification,  immobilizationProgression since onset: : waxing and waningIs this a work related condition? : NoCurrent work/school status:: usual activitiesQuestionnaire about: Left Wrist Pain (Submitted on 06/11/2024)Chief Complaint: Left Wrist Pain

## 2024-06-12 NOTE — Procedures (Addendum)
 Cast/Splint application Date/Time: 06/12/2024 8:40 AM Performed by: Evern Katz, RN Authorized by: Jiles Agent, PA Consent given by: patientTimeout: Immediately prior to procedure a time out was called to verify the correct patient, procedure, equipment, support staff and site/side marked as requiredInjuryLocation details: left wristProcedureImmobilization: castCast type: short armSupplies used: fiberglass, waterproof and skin protective stripFiberglass quantity of rolls: 2Waterproof quantity of rolls: 2Post-procedure assessmentPatient tolerance: patient tolerated the procedure well with no immediate complications

## 2024-06-24 ENCOUNTER — Telehealth: Payer: Self-pay | Admitting: Orthopedic Surgery

## 2024-06-24 NOTE — Telephone Encounter (Signed)
 Patient called for cast change of left wrist because its become uncomfortable and starting to wear down near her thumb. Available today or tomorrow and would prefer [place] office

## 2024-06-24 NOTE — Progress Notes (Signed)
 Patient arriving to office for a cast change - pain/discomfort. Patient to come in on June 25, 2024 at [place]. OK per Signe Cirri, PA. Follow up as scheduled. Benton Phenix, RN

## 2024-06-25 ENCOUNTER — Ambulatory Visit

## 2024-06-25 ENCOUNTER — Other Ambulatory Visit: Payer: Self-pay

## 2024-06-25 DIAGNOSIS — S52552A Other extraarticular fracture of lower end of left radius, initial encounter for closed fracture: Secondary | ICD-10-CM

## 2024-06-25 NOTE — Procedures (Signed)
 Cast/Splint application Date/Time: 06/25/2024 3:00 PM Performed by: Viktoria Garre, RN Authorized by: Jiles Agent, PA Consent given by: patientTimeout: Immediately prior to procedure a time out was called to verify the correct patient, procedure, equipment, support staff and site/side marked as requiredInjuryLocation details: left wristProcedureImmobilization: castCast type: short armSupplies used: cotton padding and fiberglassFiberglass quantity of rolls: 2Post-procedure assessmentPatient tolerance: patient tolerated the procedure well with no immediate complicationsCommentsShort arm cotton Skin was clean/dry/intact prior to casting. Extra padding on boney prominences as needed. Pt comfortable after application of cast. Educated on cast care instructions and advice for life with a cast. Educated that with the padding and given activity level that they may need to return if cast gets loose over next 2-4 weeks. Verbally reinforced education on cast instructions and cast care. Reinforced to contact/call the clinic with any and all concerns related to the injury or cast. DME dispensed: no DME today

## 2024-06-25 NOTE — Procedures (Signed)
 ONV visit at [facility], approved by Jiles, for splint/cast change today. Pt called with concerns of pressure and pain issues with the cast. On arrival, patient explains there is a pressure point on her thumb and her ulnar styloid area hurts. Expressed discomfort with current cast. RN and patient worked together to come up with plan of care appropriate for their injury. Pt comfortable with plan of care.Therefore cast removed at today's ONV visit. Skin is clean/dry/intact. No signs of maceration or irritation. Skin is cleaned with soap and water, then thoroughly dried prior to casting. No wounds or skin tears present. New short arm cotton cast was applied - INSTEAD of gortex to try and enhace comfort.. Pt comfortable with plan of care and new cast application. Educated on cast care instructions and reasons to return to office for cast issues. Verbally reinforced education to RN. Contact/call clinic with any and all concerns with injury or cast. See Caster/RN note for further detail.

## 2024-07-06 ENCOUNTER — Other Ambulatory Visit: Payer: Self-pay

## 2024-07-06 ENCOUNTER — Ambulatory Visit: Attending: Orthopedic Surgery | Admitting: Orthopedic Surgery

## 2024-07-06 ENCOUNTER — Ambulatory Visit: Admission: RE | Admit: 2024-07-06 | Discharge: 2024-07-06 | Disposition: A | Source: Ambulatory Visit

## 2024-07-06 ENCOUNTER — Encounter: Payer: Self-pay | Admitting: Orthopedic Surgery

## 2024-07-06 VITALS — Ht 69.0 in | Wt 200.0 lb

## 2024-07-06 DIAGNOSIS — S52552A Other extraarticular fracture of lower end of left radius, initial encounter for closed fracture: Secondary | ICD-10-CM

## 2024-07-06 NOTE — Progress Notes (Signed)
 CHIEF COMPLAINT: Follow-up nondisplaced left distal radius fracture 12/20/2025INTERVAL HISTORY: Patient is 37 days out from injury, she has been comfortable in the cast, with the cast off she has slight discomfort across the carpus.  No paresthesias, no other complaints.PFSH:  Since last visit no change.ROS:  Since last visit no change.MEDICATION:  Since last visit no new meds.ALLERGIES:  As per initial evaluation.PHYSICAL EXAM: Cast is removed no swelling or ecchymosis, mild tenderness across the carpus but not at the radius, she lacks only 2 or 3 degrees of flexion and extension but has full pronation and supination.  Excellent motion of the digits.  Skin intact, neurovascularly intact.IMAGING: I personally reviewed the following images; wrist x-ray shows anatomic alignment with excellent healing.ASSESSMENT: Healed distal radius fracture, 5 weeks out, doing wellPLAN: Patient is fitted for and provided a lace-up wrist brace, she is provided home exercises and already has near full motion.  She will wear the brace pretty regularly over the next week and then start to progress out of the brace as comfort allows.  Gently progress activities, follow-up for repeat physical exam is offered in 6 weeks but feels comfortable seeing how she does over the next 3 to 4 weeks, sending a message through MyChart, and setting up a follow-up if having any problems.SUPERVISING PHYSICIAN: Dr. Shepard TODAY: Lacer wrist braceORDERS PLACED FOR NEXT VISIT:PERCENT OF TEMPORARY IMPAIRMENT:Patient was prescribed a WRIST LACER - 8. The patient has weakness and / or instability of their left which requires stabilization from this semi-rigid / rigid orthosis to improve their function.Verbal and written instructions for the use, care and application of this item were given. Patient was instructed that should the brace result in increased pain, decreased sensation,  increased swelling, or an overall worsening of their medical condition, to please contact our office.The above document was generated using voice recognition software. Reasonable attempts at correction were made. Please excuse any unintended transcription errors.
